# Patient Record
Sex: Female | Born: 1955 | Race: White | Hispanic: No | Marital: Married | Smoking: Former smoker
Health system: Southern US, Community
[De-identification: ages and names within clinical notes are randomized; demographics above are authoritative.]

## PROBLEM LIST (undated history)

## (undated) DIAGNOSIS — U071 COVID-19: Secondary | ICD-10-CM

## (undated) DIAGNOSIS — E785 Hyperlipidemia, unspecified: Secondary | ICD-10-CM

## (undated) DIAGNOSIS — E7800 Pure hypercholesterolemia, unspecified: Secondary | ICD-10-CM

## (undated) DIAGNOSIS — I10 Essential (primary) hypertension: Secondary | ICD-10-CM

## (undated) HISTORY — PX: CARDIAC SURGERY: SHX584

## (undated) HISTORY — DX: COVID-19: U07.1

---
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
## 1999-07-17 ENCOUNTER — Emergency Department (HOSPITAL_COMMUNITY): Admission: EM | Admit: 1999-07-17 | Discharge: 1999-07-17 | Payer: Self-pay

## 2003-05-16 ENCOUNTER — Ambulatory Visit (HOSPITAL_COMMUNITY): Admission: RE | Admit: 2003-05-16 | Discharge: 2003-05-17 | Payer: Self-pay | Admitting: Cardiology

## 2008-07-10 ENCOUNTER — Encounter: Admission: RE | Admit: 2008-07-10 | Discharge: 2008-07-10 | Payer: Self-pay | Admitting: Cardiology

## 2008-12-24 ENCOUNTER — Encounter: Admission: RE | Admit: 2008-12-24 | Discharge: 2008-12-24 | Payer: Self-pay | Admitting: Gynecology

## 2010-01-21 ENCOUNTER — Encounter
Admission: RE | Admit: 2010-01-21 | Discharge: 2010-01-21 | Payer: Self-pay | Source: Home / Self Care | Attending: Gynecology | Admitting: Gynecology

## 2010-02-15 ENCOUNTER — Encounter: Payer: Self-pay | Admitting: Cardiology

## 2010-06-11 NOTE — Cardiovascular Report (Signed)
Tiffany Ryan, Tiffany Ryan                              ACCOUNT NO.:  [id number]   MEDICAL RECORD NO.:  [id number]                   PATIENT TYPE:  OIB   LOCATION:  2899                                 FACILITY:  [facility]   PHYSICIAN:  Cristy Hilts. Jacinto Halim, M.D.                  DATE OF BIRTH:  1955/10/30   DATE OF PROCEDURE:  05/16/2003  DATE OF DISCHARGE:                              CARDIAC CATHETERIZATION   REFERRING PHYSICIAN:  Tracey Harries, M.D.   PROCEDURES PERFORMED:  1. Percutaneous transluminal coronary angioplasty and stenting of the right     coronary artery.  2. Left coronary arteriography.   CARDIOLOGIST:  Cristy Hilts. Jacinto Halim, M.D.   INDICATIONS:  Tiffany Ryan is a 55 year old Caucasian female with history  of hypertension, hyperlipidemia and strong family history of premature CAD  who has had a diagnostic cardiac catheterization at [facility]  and was found to have a 99% mid RCA stenosis and a 90% RCA stenosis.  Given  this she was transferred over [facility] for elective PCI of the  right coronary artery.  The left coronaries were visualized with the  possibility of reevaluating her mid to distal LAD lesion, which had about  70% stenosis by diagnostic cardiac catheterization, for possible  angioplasty.   IMPRESSION:  Successful percutaneous transluminal coronary angioplasty and  stenting of the distal and mid right coronary artery with a 3.0 x 18 and a  3.0 x 18 millimeter CYPHER stent deployed at 16 atmospheric pressure.  The  stenosis was reduced from 90% to 0% in the distal and 99% to 0% in the mid  segment.  Excellent results were noted with no evidence of dissection and no  evidence of thrombus at the end of the procedure.   RECOMMENDATIONS:  The patient will be followed all night with CPKs and BMP  will be monitored, and the CBC will also be monitored.  She will be  discharged home if she remains stable.  The patient is to continue  aggressive risk factor  modification with ____________  lipids, smoking  cessation and weight loss as indicated.  Unless the patient has recurrent  chest discomfort she will be managed medically for the left coronary  arterial disease in the LAD.   ANGIOGRAPHIC DATA:  Right Coronary Artery:  The right coronary artery is a  large caliber vessel and a dominant vessel giving a large PLV branch.  The  mid RCA had 99% stenosis and the distal RCA had 90% stenosis.  The entire  RCA showed mild-to-moderate diffuse luminal irregularity.   Left Main Coronary Artery:  The left main coronary artery had mild  calcification.   Left Anterior Descending:  The LAD is diffusely diseased and gives origin to  a large diagonal-1, again, which has diffuse, moderate disease.  The LAD  itself is diseased all the way to the apex;  and, the mid segment, again, has  70% stenosis.  The vessel appears to be a 2.0 mm vessel.   Circumflex Coronary Artery:  The circumflex coronary artery was visualized  and appeared to be normal.   Ramus Intermedius:  The ramus intermedius appeared to be normal.  Again,  mild disease is noted throughout the ramus constituting noncritical coronary  artery disease.   TECHNIQUE OF THE PROCEDURE:  Under the usual sterile precautions using a 7  French right femoral artery access a 7 [place] FL-4 guide with side holes was  placed to engage the right coronary artery.  A 190 cm x 0.014 inch ATW  guidewire was placed to cross into the RCA lesion.  Then a 2.5 x 12 mm  Voyager balloon was utilized and two inflations; one inflation in the mid  and one in the distal RCA were performed at eight and 10 atmospheric  pressure for 70 seconds.  The balloon was deflated and pulled back into the  guiding catheter.  Arteriography was performed.   After predilatation a 3.0 x 18 mm CYPHER stent was utilized to stent the  distal RCA and a similar size stent was utilize to stent the mid RCA.  The  inflations were performed at 16  atmospheric pressure for 66 seconds and 44  seconds respectively; and, after pulling the balloon into the guide catheter  200 mcg of intracoronary nitroglycerin was administered.  Angiography was  repeated.  Excellent results were noted.  After confirming the success the  wire was pulled out the RCA and angiography was repeated.  The guide  catheter was disengaged  and pulled out the body in the usual fashion.  A 6  [place] Judkins-4 diagnostic catheter was utilized to engage the left main  coronary artery and angiography was repeated of the left coronary arterial  tree for visualization of the LAD, for possible revascularization strategy.  Because of the diffuse disease and the diffuse nature of the disease in the  LAD medical therapy was opted for.   The guide catheter was disengaged and pulled out the body in the usual  fashion.   The patient was then transferred to the recovery area ____________ in stable  condition.   During the procedure Angiomax was utilized for anticoagulation and  therapeutic ACT was maintained.  The patient also received a total of 600 mg  of Plavix during and immediately after the PCI.                                               Cristy Hilts. Jacinto Halim, M.D.    Pilar Plate  D:  05/16/2003  T:  05/18/2003  Job:  [id number]   cc:   Tracey Harries, M.D.  [address]  [place]  [place] [address]  Fax: [phone]   [facility]

## 2014-01-07 ENCOUNTER — Other Ambulatory Visit (HOSPITAL_COMMUNITY)
Admission: RE | Admit: 2014-01-07 | Discharge: 2014-01-07 | Disposition: A | Discharge status: Home / Self Care | Payer: BC Managed Care – PPO | Source: Ambulatory Visit | Attending: Nurse Practitioner | Admitting: Nurse Practitioner

## 2014-01-07 ENCOUNTER — Other Ambulatory Visit: Payer: Self-pay | Admitting: Nurse Practitioner

## 2014-01-07 DIAGNOSIS — Z01419 Encounter for gynecological examination (general) (routine) without abnormal findings: Secondary | ICD-10-CM | POA: Diagnosis not present

## 2014-01-07 DIAGNOSIS — Z1151 Encounter for screening for human papillomavirus (HPV): Secondary | ICD-10-CM | POA: Diagnosis present

## 2014-01-09 LAB — CYTOLOGY - PAP

## 2014-03-22 ENCOUNTER — Encounter (HOSPITAL_COMMUNITY): Payer: Self-pay | Admitting: *Deleted

## 2014-03-22 ENCOUNTER — Emergency Department (HOSPITAL_COMMUNITY)
Admission: EM | Admit: 2014-03-22 | Discharge: 2014-03-22 | Disposition: A | Discharge status: Home / Self Care | Payer: No Typology Code available for payment source | Attending: Emergency Medicine | Admitting: Emergency Medicine

## 2014-03-22 ENCOUNTER — Emergency Department (HOSPITAL_COMMUNITY): Payer: No Typology Code available for payment source

## 2014-03-22 DIAGNOSIS — R630 Anorexia: Secondary | ICD-10-CM | POA: Insufficient documentation

## 2014-03-22 DIAGNOSIS — R1032 Left lower quadrant pain: Secondary | ICD-10-CM | POA: Diagnosis not present

## 2014-03-22 DIAGNOSIS — R1012 Left upper quadrant pain: Secondary | ICD-10-CM | POA: Diagnosis not present

## 2014-03-22 DIAGNOSIS — Z7982 Long term (current) use of aspirin: Secondary | ICD-10-CM | POA: Insufficient documentation

## 2014-03-22 DIAGNOSIS — Z79899 Other long term (current) drug therapy: Secondary | ICD-10-CM | POA: Insufficient documentation

## 2014-03-22 DIAGNOSIS — R739 Hyperglycemia, unspecified: Secondary | ICD-10-CM | POA: Diagnosis not present

## 2014-03-22 DIAGNOSIS — R109 Unspecified abdominal pain: Secondary | ICD-10-CM | POA: Diagnosis present

## 2014-03-22 DIAGNOSIS — Z87440 Personal history of urinary (tract) infections: Secondary | ICD-10-CM | POA: Insufficient documentation

## 2014-03-22 LAB — URINALYSIS, ROUTINE W REFLEX MICROSCOPIC
Bilirubin Urine: NEGATIVE
Glucose, UA: NEGATIVE mg/dL
HGB URINE DIPSTICK: NEGATIVE
KETONES UR: NEGATIVE mg/dL
LEUKOCYTES UA: NEGATIVE
NITRITE: NEGATIVE
PH: 7.5 (ref 5.0–8.0)
Protein, ur: NEGATIVE mg/dL
SPECIFIC GRAVITY, URINE: 1.018 (ref 1.005–1.030)
Urobilinogen, UA: 0.2 mg/dL (ref 0.0–1.0)

## 2014-03-22 LAB — CBC WITH DIFFERENTIAL/PLATELET
BASOS ABS: 0 10*3/uL (ref 0.0–0.1)
Basophils Relative: 0 % (ref 0–1)
EOS ABS: 0 10*3/uL (ref 0.0–0.7)
EOS PCT: 0 % (ref 0–5)
HEMATOCRIT: 40.7 % (ref 36.0–46.0)
Hemoglobin: 13.4 g/dL (ref 12.0–15.0)
LYMPHS ABS: 2 10*3/uL (ref 0.7–4.0)
LYMPHS PCT: 32 % (ref 12–46)
MCH: 30.2 pg (ref 26.0–34.0)
MCHC: 32.9 g/dL (ref 30.0–36.0)
MCV: 91.7 fL (ref 78.0–100.0)
MONOS PCT: 5 % (ref 3–12)
Monocytes Absolute: 0.3 10*3/uL (ref 0.1–1.0)
NEUTROS ABS: 4 10*3/uL (ref 1.7–7.7)
Neutrophils Relative %: 63 % (ref 43–77)
Platelets: 327 10*3/uL (ref 150–400)
RBC: 4.44 MIL/uL (ref 3.87–5.11)
RDW: 12.9 % (ref 11.5–15.5)
WBC: 6.2 10*3/uL (ref 4.0–10.5)

## 2014-03-22 LAB — COMPREHENSIVE METABOLIC PANEL
ALBUMIN: 4.3 g/dL (ref 3.5–5.2)
ALT: 20 U/L (ref 0–35)
ANION GAP: 5 (ref 5–15)
AST: 18 U/L (ref 0–37)
Alkaline Phosphatase: 52 U/L (ref 39–117)
BILIRUBIN TOTAL: 0.5 mg/dL (ref 0.3–1.2)
BUN: 9 mg/dL (ref 6–23)
CHLORIDE: 107 mmol/L (ref 96–112)
CO2: 27 mmol/L (ref 19–32)
Calcium: 9.5 mg/dL (ref 8.4–10.5)
Creatinine, Ser: 0.69 mg/dL (ref 0.50–1.10)
GFR calc Af Amer: >90 mL/min (ref >90)
GFR calc non Af Amer: >90 mL/min (ref >90)
Glucose, Bld: 146 mg/dL — ABNORMAL HIGH (ref 70–99)
POTASSIUM: 3.6 mmol/L (ref 3.5–5.1)
Sodium: 139 mmol/L (ref 135–145)
TOTAL PROTEIN: 7.3 g/dL (ref 6.0–8.3)

## 2014-03-22 LAB — LIPASE, BLOOD: LIPASE: 24 U/L (ref 11–59)

## 2014-03-22 MED ORDER — ONDANSETRON HCL 4 MG PO TABS: 4.0000 mg | ORAL_TABLET | Freq: Three times a day (TID) | ORAL | Status: DC | PRN

## 2014-03-22 MED ORDER — PHENAZOPYRIDINE HCL 200 MG PO TABS: 200.0000 mg | ORAL_TABLET | Freq: Three times a day (TID) | ORAL | Status: DC

## 2014-03-22 MED ORDER — SODIUM CHLORIDE 0.9 % IV BOLUS (SEPSIS)
1000.0000 mL | Freq: Once | INTRAVENOUS | Status: AC
  Administered 2014-03-22: 1000 mL via INTRAVENOUS

## 2014-03-22 MED ORDER — ONDANSETRON HCL 4 MG/2ML IJ SOLN
4.0000 mg | Freq: Once | INTRAMUSCULAR | Status: AC
  Administered 2014-03-22: 4 mg via INTRAVENOUS
  Filled 2014-03-22: qty 2

## 2014-03-22 NOTE — ED Notes (Signed)
Pt reports having lower abd pain for months, is more left side and radiates around to her side and back. Reports being started on antibiotics yesterday for UTI but no relief yet and had n/v this am. Denies difficulty urinating, only burning pain.

## 2014-03-22 NOTE — Discharge Instructions (Signed)
Read the information below.  Use the prescribed medication as directed.  Please discuss all new medications with your pharmacist.  You may return to the Emergency Department at any time for worsening condition or any new symptoms that concern you.  If you develop high fevers, worsening abdominal pain, uncontrolled vomiting, or are unable to tolerate fluids by mouth, return to the ER for a recheck.     Flank Pain Flank pain refers to pain that is located on the side of the body between the upper abdomen and the back. The pain may occur over a short period of time (acute) or may be long-term or reoccurring (chronic). It may be mild or severe. Flank pain can be caused by many things. CAUSES  Some of the more common causes of flank pain include:  Muscle strains.   Muscle spasms.   A disease of your spine (vertebral disk disease).   A lung infection (pneumonia).   Fluid around your lungs (pulmonary edema).   A kidney infection.   Kidney stones.   A very painful skin rash caused by the chickenpox virus (shingles).   Gallbladder disease.  HOME CARE INSTRUCTIONS  Home care will depend on the cause of your pain. In general,  Rest as directed by your caregiver.  Drink enough fluids to keep your urine clear or pale yellow.  Only take over-the-counter or prescription medicines as directed by your caregiver. Some medicines may help relieve the pain.  Tell your caregiver about any changes in your pain.  Follow up with your caregiver as directed. SEEK IMMEDIATE MEDICAL CARE IF:   Your pain is not controlled with medicine.   You have new or worsening symptoms.  Your pain increases.   You have abdominal pain.   You have shortness of breath.   You have persistent nausea or vomiting.   You have swelling in your abdomen.   You feel faint or pass out.   You have blood in your urine.  You have a fever or persistent symptoms for more than 2-3 days.  You have a  fever and your symptoms suddenly get worse. MAKE SURE YOU:   Understand these instructions.  Will watch your condition.  Will get help right away if you are not doing well or get worse. Document Released: 03/03/2005 Document Revised: 10/05/2011 Document Reviewed: 08/25/2011 [facility] Patient Information 2015 [place], [place]. This information is not intended to replace advice given to you by your health care provider. Make sure you discuss any questions you have with your health care provider.   Abdominal Pain Many things can cause abdominal pain. Usually, abdominal pain is not caused by a disease and will improve without treatment. It can often be observed and treated at home. Your health care provider will do a physical exam and possibly order blood tests and X-rays to help determine the seriousness of your pain. However, in many cases, more time must pass before a clear cause of the pain can be found. Before that point, your health care provider may not know if you need more testing or further treatment. HOME CARE INSTRUCTIONS  Monitor your abdominal pain for any changes. The following actions may help to alleviate any discomfort you are experiencing:  Only take over-the-counter or prescription medicines as directed by your health care provider.  Do not take laxatives unless directed to do so by your health care provider.  Try a clear liquid diet (broth, tea, or water) as directed by your health care provider. Slowly move  to a bland diet as tolerated. SEEK MEDICAL CARE IF:  You have unexplained abdominal pain.  You have abdominal pain associated with nausea or diarrhea.  You have pain when you urinate or have a bowel movement.  You experience abdominal pain that wakes you in the night.  You have abdominal pain that is worsened or improved by eating food.  You have abdominal pain that is worsened with eating fatty foods.  You have a fever. SEEK IMMEDIATE MEDICAL CARE IF:   Your  pain does not go away within 2 hours.  You keep throwing up (vomiting).  Your pain is felt only in portions of the abdomen, such as the right side or the left lower portion of the abdomen.  You pass bloody or black tarry stools. MAKE SURE YOU:  Understand these instructions.   Will watch your condition.   Will get help right away if you are not doing well or get worse.  Document Released: 10/20/2004 Document Revised: 01/15/2013 Document Reviewed: 09/19/2012 [facility] Patient Information 2015 [place]. This information is not intended to replace advice given to you by your health care provider. Make sure you discuss any questions you have with your health care provider.

## 2014-03-22 NOTE — ED Provider Notes (Signed)
CSN: [id number]     Arrival date & time 03/22/14  1217 History   First MD Initiated Contact with Patient 03/22/14 [phone]     Chief Complaint  Patient presents with  . Abdominal Pain     (Consider location/radiation/quality/duration/timing/severity/associated sxs/prior Treatment) The history is provided by the patient and a relative.     Patient presents with left sided abdominal pain, dysuria, N/V.  She has had intermittent lower abdominal burning and pressure for the past several months, with intermittent urinary symptoms and treatment for UTIs, has had one month of more persistent intermittent LLQ pain.  Yesterday she was seen at her PCP [facility] and was started on Ciprofloxacin for UTI - told she had hematuria but few bacteria.  She has taken 3 doses.  Today she developed more diffuse pain, N/V, anorexia.   No hx abdominal surgeries.  She believes her mother had a hx kidney stones, she also had a kidney removed but pt does not know why.  Pt does not have hx kidney stones.    History reviewed. No pertinent past medical history. History reviewed. No pertinent past surgical history. History reviewed. No pertinent family history. History  Substance Use Topics  . Smoking status: Never Smoker   . Smokeless tobacco: Not on file  . Alcohol Use: No   OB History    No data available     Review of Systems  All other systems reviewed and are negative.     Allergies  Review of patient's allergies indicates no known allergies.  Home Medications   Prior to Admission medications   Medication Sig Start Date End Date Taking? Authorizing Provider  aspirin 81 MG tablet Take 81 mg by mouth daily.   Yes Historical Provider, MD  ciprofloxacin (CIPRO) 500 MG tablet Take 500 mg by mouth 2 (two) times daily. Take for 5 days. First dose on 2.26.16   Yes Historical Provider, MD  ezetimibe (ZETIA) 10 MG tablet Take 10 mg by mouth daily.   Yes Historical Provider, MD  metoprolol tartrate (LOPRESSOR) 25  MG tablet Take 25 mg by mouth 2 (two) times daily.   Yes Historical Provider, MD  ramipril (ALTACE) 10 MG capsule Take 10 mg by mouth daily.   Yes Historical Provider, MD  rosuvastatin (CRESTOR) 40 MG tablet Take 40 mg by mouth daily.   Yes Historical Provider, MD   BP 155/70 mmHg  Pulse 80  Temp(Src) 97.9 F (36.6 C) (Oral)  Resp 18  Ht 5\' 2"  (1.575 m)  Wt 160 lb (72.576 kg)  BMI 29.26 kg/m2  SpO2 99% Physical Exam  Constitutional: She appears well-developed and well-nourished. No distress.  HENT:  Head: Normocephalic and atraumatic.  Neck: Neck supple.  Cardiovascular: Normal rate and regular rhythm.   Pulmonary/Chest: Effort normal and breath sounds normal. No respiratory distress. She has no wheezes. She has no rales.  Abdominal: Soft. She exhibits no distension and no mass. There is tenderness in the left upper quadrant and left lower quadrant. There is no rebound, no guarding and no CVA tenderness.  Neurological: She is alert.  Skin: She is not diaphoretic.  Nursing note and vitals reviewed.   ED Course  Procedures (including critical care time) Labs Review Labs Reviewed  COMPREHENSIVE METABOLIC PANEL - Abnormal; Notable for the following:    Glucose, Bld 146 (*)    All other components within normal limits  URINALYSIS, ROUTINE W REFLEX MICROSCOPIC - Abnormal; Notable for the following:    APPearance CLOUDY (*)  All other components within normal limits  CBC WITH DIFFERENTIAL/PLATELET  LIPASE, BLOOD    Imaging Review Ct Abdomen Pelvis Wo Contrast  03/22/2014   CLINICAL DATA:  Lower abdominal pain for months, left-sided, radiating to back.  EXAM: CT ABDOMEN AND PELVIS WITHOUT CONTRAST  TECHNIQUE: Multidetector CT imaging of the abdomen and pelvis was performed following the standard protocol without IV contrast.  COMPARISON:  None.  FINDINGS: Lung bases are clear. No effusions. Heart is normal size. Right coronary artery calcifications present.  Liver, gallbladder,  spleen, pancreas, adrenals and kidneys have an unremarkable unenhanced appearance. No renal or ureteral stones. No hydronephrosis. Urinary bladder is unremarkable.  Uterus, adnexa, urinary bladder unremarkable. No free fluid, free air or adenopathy. Appendix is visualized and is normal. Stomach, large and small bowel are unremarkable. Aortic calcifications diffusely. No aneurysm.  No acute bony abnormality or focal bone lesion. Degenerative disc disease at L5-S1 with disc space narrowing, vacuum disc and spurring.  IMPRESSION: No renal or ureteral stones.  No hydronephrosis.  Coronary artery disease.  No acute findings in the abdomen or pelvis.   Electronically Signed   By: Charlett Nose M.D.   On: 03/22/2014 17:33     EKG Interpretation None      MDM   Final diagnoses:  Left flank pain  Hyperglycemia    Afebrile nontoxic patient with left flank pain and dysuria, also with hx chronic intermittent burning lower abdominal pain.  Started on Cipro yesterday by PCP. Labs remarkable for hyperglycemia (146).  CT abd/pelvis negative.  UA cloudy but otherwise unremarkable.  I suspect pt has partially treated UTI.  Original UA performed by [facility] PCP.  Pt also has some chronic lower abdominal pain but notes she has had multiple workups and has recently had normal pap and gynecologic exam (December 2015) and normal colonoscopy (December 2015).  D/C home with pyridium and zofran, instructed to continue cipro and to follow up with PCP.  Discussed result, findings, treatment, and follow up  with patient.  Pt given return precautions.  Pt verbalizes understanding and agrees with plan.        Trixie Dredge, PA-C 03/22/14 7425  Vanetta Mulders, MD 03/23/14 2255

## 2014-03-24 LAB — URINE CULTURE
COLONY COUNT: NO GROWTH
CULTURE: NO GROWTH

## 2015-01-13 ENCOUNTER — Other Ambulatory Visit (HOSPITAL_COMMUNITY)
Admission: RE | Admit: 2015-01-13 | Discharge: 2015-01-13 | Disposition: A | Discharge status: Home / Self Care | Payer: BLUE CROSS/BLUE SHIELD | Source: Ambulatory Visit | Attending: Nurse Practitioner | Admitting: Nurse Practitioner

## 2015-01-13 ENCOUNTER — Other Ambulatory Visit: Payer: Self-pay | Admitting: Nurse Practitioner

## 2015-01-13 DIAGNOSIS — Z1151 Encounter for screening for human papillomavirus (HPV): Secondary | ICD-10-CM | POA: Diagnosis present

## 2015-01-13 DIAGNOSIS — Z01419 Encounter for gynecological examination (general) (routine) without abnormal findings: Secondary | ICD-10-CM | POA: Insufficient documentation

## 2015-01-15 LAB — CYTOLOGY - PAP

## 2015-11-18 ENCOUNTER — Other Ambulatory Visit (HOSPITAL_COMMUNITY): Payer: Self-pay | Admitting: Internal Medicine

## 2015-11-18 DIAGNOSIS — R1033 Periumbilical pain: Secondary | ICD-10-CM

## 2015-11-24 ENCOUNTER — Ambulatory Visit (HOSPITAL_COMMUNITY): Payer: No Typology Code available for payment source

## 2015-11-27 ENCOUNTER — Ambulatory Visit (HOSPITAL_COMMUNITY)
Admission: RE | Admit: 2015-11-27 | Discharge: 2015-11-27 | Disposition: A | Discharge status: Home / Self Care | Payer: BLUE CROSS/BLUE SHIELD | Source: Ambulatory Visit | Attending: Internal Medicine | Admitting: Internal Medicine

## 2015-11-27 DIAGNOSIS — R1033 Periumbilical pain: Secondary | ICD-10-CM | POA: Diagnosis present

## 2016-02-05 DIAGNOSIS — I447 Left bundle-branch block, unspecified: Secondary | ICD-10-CM | POA: Diagnosis not present

## 2016-02-05 DIAGNOSIS — Z9861 Coronary angioplasty status: Secondary | ICD-10-CM | POA: Diagnosis not present

## 2016-02-05 DIAGNOSIS — E7800 Pure hypercholesterolemia, unspecified: Secondary | ICD-10-CM | POA: Diagnosis not present

## 2016-02-05 DIAGNOSIS — I2510 Atherosclerotic heart disease of native coronary artery without angina pectoris: Secondary | ICD-10-CM | POA: Diagnosis not present

## 2016-03-01 DIAGNOSIS — R0989 Other specified symptoms and signs involving the circulatory and respiratory systems: Secondary | ICD-10-CM | POA: Diagnosis not present

## 2016-03-06 ENCOUNTER — Emergency Department (HOSPITAL_COMMUNITY): Payer: BLUE CROSS/BLUE SHIELD

## 2016-03-06 ENCOUNTER — Encounter (HOSPITAL_COMMUNITY): Payer: Self-pay | Admitting: Emergency Medicine

## 2016-03-06 ENCOUNTER — Observation Stay (HOSPITAL_COMMUNITY)
Admission: EM | Admit: 2016-03-06 | Discharge: 2016-03-08 | Disposition: A | Discharge status: Home / Self Care | Payer: BLUE CROSS/BLUE SHIELD | Attending: Cardiology | Admitting: Cardiology

## 2016-03-06 DIAGNOSIS — Z7982 Long term (current) use of aspirin: Secondary | ICD-10-CM | POA: Insufficient documentation

## 2016-03-06 DIAGNOSIS — I2584 Coronary atherosclerosis due to calcified coronary lesion: Secondary | ICD-10-CM | POA: Diagnosis not present

## 2016-03-06 DIAGNOSIS — Z87891 Personal history of nicotine dependence: Secondary | ICD-10-CM | POA: Insufficient documentation

## 2016-03-06 DIAGNOSIS — I200 Unstable angina: Secondary | ICD-10-CM | POA: Diagnosis not present

## 2016-03-06 DIAGNOSIS — E785 Hyperlipidemia, unspecified: Secondary | ICD-10-CM | POA: Insufficient documentation

## 2016-03-06 DIAGNOSIS — I119 Hypertensive heart disease without heart failure: Secondary | ICD-10-CM | POA: Insufficient documentation

## 2016-03-06 DIAGNOSIS — Z955 Presence of coronary angioplasty implant and graft: Secondary | ICD-10-CM | POA: Insufficient documentation

## 2016-03-06 DIAGNOSIS — I700 Atherosclerosis of aorta: Secondary | ICD-10-CM | POA: Insufficient documentation

## 2016-03-06 DIAGNOSIS — I447 Left bundle-branch block, unspecified: Secondary | ICD-10-CM | POA: Insufficient documentation

## 2016-03-06 DIAGNOSIS — Z8249 Family history of ischemic heart disease and other diseases of the circulatory system: Secondary | ICD-10-CM | POA: Diagnosis not present

## 2016-03-06 DIAGNOSIS — I25110 Atherosclerotic heart disease of native coronary artery with unstable angina pectoris: Principal | ICD-10-CM | POA: Insufficient documentation

## 2016-03-06 DIAGNOSIS — E7800 Pure hypercholesterolemia, unspecified: Secondary | ICD-10-CM | POA: Diagnosis not present

## 2016-03-06 HISTORY — DX: Unstable angina: I20.0

## 2016-03-06 HISTORY — DX: Pure hypercholesterolemia, unspecified: E78.00

## 2016-03-06 HISTORY — DX: Essential (primary) hypertension: I10

## 2016-03-06 LAB — BASIC METABOLIC PANEL
Anion gap: 9 (ref 5–15)
BUN: 9 mg/dL (ref 6–20)
CALCIUM: 9.9 mg/dL (ref 8.9–10.3)
CO2: 25 mmol/L (ref 22–32)
Chloride: 106 mmol/L (ref 101–111)
Creatinine, Ser: 0.74 mg/dL (ref 0.44–1.00)
GFR calc Af Amer: >60 mL/min (ref >60)
GLUCOSE: 129 mg/dL — AB (ref 65–99)
Potassium: 4.4 mmol/L (ref 3.5–5.1)
SODIUM: 140 mmol/L (ref 135–145)

## 2016-03-06 LAB — CBC
HCT: 41.1 % (ref 36.0–46.0)
Hemoglobin: 13.5 g/dL (ref 12.0–15.0)
MCH: 30.3 pg (ref 26.0–34.0)
MCHC: 32.8 g/dL (ref 30.0–36.0)
MCV: 92.2 fL (ref 78.0–100.0)
Platelets: 318 10*3/uL (ref 150–400)
RBC: 4.46 MIL/uL (ref 3.87–5.11)
RDW: 13.1 % (ref 11.5–15.5)
WBC: 5.3 10*3/uL (ref 4.0–10.5)

## 2016-03-06 LAB — PROTIME-INR
INR: 1.07
PROTHROMBIN TIME: 14 s (ref 11.4–15.2)

## 2016-03-06 LAB — HEPARIN LEVEL (UNFRACTIONATED): Heparin Unfractionated: 0.42 IU/mL (ref 0.30–0.70)

## 2016-03-06 LAB — I-STAT TROPONIN, ED: TROPONIN I, POC: 0 ng/mL (ref 0.00–0.08)

## 2016-03-06 LAB — TROPONIN I
Troponin I: <0.03 ng/mL (ref <0.03)
Troponin I: <0.03 ng/mL (ref <0.03)

## 2016-03-06 MED ORDER — ASPIRIN EC 81 MG PO TBEC
81.0000 mg | DELAYED_RELEASE_TABLET | Freq: Every day | ORAL | Status: DC
  Administered 2016-03-07: 81 mg via ORAL
  Filled 2016-03-06 (×2): qty 1

## 2016-03-06 MED ORDER — MAGNESIUM 200 MG PO TABS
30.0000 mg | ORAL_TABLET | ORAL | Status: DC
  Filled 2016-03-06: qty 1

## 2016-03-06 MED ORDER — MAGNESIUM OXIDE 400 (241.3 MG) MG PO TABS
200.0000 mg | ORAL_TABLET | ORAL | Status: DC
  Administered 2016-03-07: 200 mg via ORAL
  Filled 2016-03-06: qty 1

## 2016-03-06 MED ORDER — ASPIRIN 81 MG PO CHEW
324.0000 mg | CHEWABLE_TABLET | Freq: Once | ORAL | Status: AC
  Administered 2016-03-06: 324 mg via ORAL
  Filled 2016-03-06: qty 4

## 2016-03-06 MED ORDER — ASPIRIN EC 81 MG PO TBEC: 81.0000 mg | DELAYED_RELEASE_TABLET | Freq: Every day | ORAL | Status: DC

## 2016-03-06 MED ORDER — VITAMIN D 1000 UNITS PO TABS
2000.0000 [IU] | ORAL_TABLET | Freq: Every day | ORAL | Status: DC
  Administered 2016-03-07: 2000 [IU] via ORAL
  Filled 2016-03-06 (×2): qty 2

## 2016-03-06 MED ORDER — ACETAMINOPHEN 325 MG PO TABS: 650.0000 mg | ORAL_TABLET | ORAL | Status: DC | PRN

## 2016-03-06 MED ORDER — ROSUVASTATIN CALCIUM 10 MG PO TABS
20.0000 mg | ORAL_TABLET | Freq: Every day | ORAL | Status: DC
  Administered 2016-03-06 – 2016-03-07 (×2): 20 mg via ORAL
  Filled 2016-03-06 (×3): qty 2

## 2016-03-06 MED ORDER — HEPARIN BOLUS VIA INFUSION
4000.0000 [IU] | Freq: Once | INTRAVENOUS | Status: AC
  Administered 2016-03-06: 4000 [IU] via INTRAVENOUS
  Filled 2016-03-06: qty 4000

## 2016-03-06 MED ORDER — ONDANSETRON HCL 4 MG/2ML IJ SOLN: 4.0000 mg | Freq: Four times a day (QID) | INTRAMUSCULAR | Status: DC | PRN

## 2016-03-06 MED ORDER — VITAMIN D 1000 UNITS PO TABS: 2000.0000 [IU] | ORAL_TABLET | Freq: Every day | ORAL | Status: DC

## 2016-03-06 MED ORDER — NITROGLYCERIN 0.4 MG SL SUBL
0.4000 mg | SUBLINGUAL_TABLET | SUBLINGUAL | Status: DC | PRN
Start: 2016-03-06 — End: 2016-03-08

## 2016-03-06 MED ORDER — EZETIMIBE 10 MG PO TABS
10.0000 mg | ORAL_TABLET | Freq: Every day | ORAL | Status: DC
  Administered 2016-03-06 – 2016-03-07 (×2): 10 mg via ORAL
  Filled 2016-03-06 (×3): qty 1

## 2016-03-06 MED ORDER — RAMIPRIL 5 MG PO CAPS
10.0000 mg | ORAL_CAPSULE | Freq: Every day | ORAL | Status: DC
  Administered 2016-03-07: 10 mg via ORAL
  Filled 2016-03-06 (×2): qty 2

## 2016-03-06 MED ORDER — CARVEDILOL 6.25 MG PO TABS
6.2500 mg | ORAL_TABLET | Freq: Two times a day (BID) | ORAL | Status: DC
  Administered 2016-03-06 – 2016-03-07 (×3): 6.25 mg via ORAL
  Filled 2016-03-06 (×4): qty 1

## 2016-03-06 MED ORDER — ZOLPIDEM TARTRATE 5 MG PO TABS
5.0000 mg | ORAL_TABLET | Freq: Every evening | ORAL | Status: DC | PRN
  Administered 2016-03-06: 5 mg via ORAL
  Filled 2016-03-06: qty 1

## 2016-03-06 MED ORDER — ROSUVASTATIN CALCIUM 10 MG PO TABS: 20.0000 mg | ORAL_TABLET | Freq: Every day | ORAL | Status: DC

## 2016-03-06 MED ORDER — HEPARIN (PORCINE) IN NACL 100-0.45 UNIT/ML-% IJ SOLN
800.0000 [IU]/h | INTRAMUSCULAR | Status: DC
  Administered 2016-03-06: 800 [IU]/h via INTRAVENOUS
  Filled 2016-03-06: qty 250

## 2016-03-06 NOTE — Progress Notes (Signed)
ANTICOAGULATION CONSULT NOTE - Follow-up Consult  Pharmacy Consult for heparin Indication: chest pain/ACS  No Known Allergies  Patient Measurements: Height: 5\' 2"  (157.5 cm) Weight: 158 lb (71.7 kg) IBW/kg (Calculated) : 50.1 Heparin Dosing Weight: 65kg  Vital Signs: Temp: 98.3 F (36.8 C) (02/11 2145) Temp Source: Oral (02/11 2145) BP: 128/74 (02/11 2145) Pulse Rate: 71 (02/11 2145)  Labs:  Recent Labs  03/06/16 1134 03/06/16 1815 03/06/16 2303  HGB 13.5  --   --   HCT 41.1  --   --   PLT 318  --   --   LABPROT  --  14.0  --   INR  --  1.07  --   HEPARINUNFRC  --   --  0.42  CREATININE 0.74  --   --   TROPONINI  --  <0.03  --     Estimated Creatinine Clearance: 69.3 mL/min (by C-G formula based on SCr of 0.74 mg/dL).  Assessment: Tiffany Ryan on heparin for r/o ACS. Heparin level therapeutic (0.42) on gtt at 800 units/hr. No bleeding noted.   Goal of Therapy:  Heparin level 0.3-0.7 units/ml Monitor platelets by anticoagulation protocol: Yes   Plan:  Continue heparin gtt 800 units/hr Daily heparin level and CBC  Christoper Fabianaron Adedamola Seto, PharmD, BCPS Clinical pharmacist, pager [phone] 03/06/2016,11:39 PM

## 2016-03-06 NOTE — ED Provider Notes (Signed)
[facility]-EMERGENCY DEPT Provider Note   CSN: [id number] Arrival date & time: 03/06/16  1119     History   Chief Complaint Chief Complaint  Patient presents with  . Chest Pain    HPI Tiffany Ryan is a 61 y.o. female.  HPI  61 y.o. Female with h.o. Cad presents today with worsening chest pain.  She has associated hypertension.  Chest pain began 7 days ago with minimal exertion sscp radiates to left, increases with exertion and is relieved by rest or nitro.  Pain occurs with any exertion, associated dyspnea. No diaphoresis.  No fever, chills, cough or uri symptoms.Patient with cath 2005-  INDICATIONS:  Mrs. Tiffany Ryan is a 61 year old Caucasian female with history  of hypertension, hyperlipidemia and strong family history of premature CAD  who has had a diagnostic cardiac catheterization at [facility]  and was found to have a 99% mid RCA stenosis and a 90% RCA stenosis.  Given  this she was transferred over [facility] for elective PCI of the  right coronary artery.  The left coronaries were visualized with the  possibility of reevaluating her mid to distal LAD lesion, which had about  70% stenosis by diagnostic cardiac catheterization, for possible  angioplasty.    IMPRESSION:  Successful percutaneous transluminal coronary angioplasty and  stenting of the distal and mid right coronary artery with a 3.0 x 18 and a  3.0 x 18 millimeter CYPHER stent deployed at 16 atmospheric pressure.  The  stenosis was reduced from 90% to 0% in the distal and 99% to 0% in the mid  segment.  Excellent results were noted with no evidence of dissection and no  evidence of thrombus at the end of the procedure.    RECOMMENDATIONS:  The patient will be followed all night with CPKs and BMP  will be monitored, and the CBC will also be monitored.  She will be  discharged home if she remains stable.  The patient is to continue  aggressive risk factor modification with ____________  lipids,  smoking  cessation and weight loss as indicated.  Unless the patient has recurrent  chest discomfort she will be managed medically for the left coronary  arterial disease in the LAD.   Past Medical History:  Diagnosis Date  . High cholesterol   . Hypertension     There are no active problems to display for this patient.   Past Surgical History:  Procedure Laterality Date  . CARDIAC SURGERY     stent x 2    OB History    No data available       Home Medications    Prior to Admission medications   Medication Sig Start Date End Date Taking? Authorizing Provider  aspirin 81 MG tablet Take 81 mg by mouth daily.    Historical Provider, MD  ciprofloxacin (CIPRO) 500 MG tablet Take 500 mg by mouth 2 (two) times daily. Take for 5 days. First dose on 2.26.16    Historical Provider, MD  ezetimibe (ZETIA) 10 MG tablet Take 10 mg by mouth daily.    Historical Provider, MD  metoprolol tartrate (LOPRESSOR) 25 MG tablet Take 25 mg by mouth 2 (two) times daily.    Historical Provider, MD  ondansetron (ZOFRAN) 4 MG tablet Take 1 tablet (4 mg total) by mouth every 8 (eight) hours as needed for nausea or vomiting. 03/22/14   Tiffany Dredge, PA-C  phenazopyridine (PYRIDIUM) 200 MG tablet Take 1 tablet (200 mg total) by  mouth 3 (three) times daily. 03/22/14   Tiffany Dredge, PA-C  ramipril (ALTACE) 10 MG capsule Take 10 mg by mouth daily.    Historical Provider, MD  rosuvastatin (CRESTOR) 40 MG tablet Take 40 mg by mouth daily.    Historical Provider, MD    Family History No family history on file.  Social History Social History  Substance Use Topics  . Smoking status: Never Smoker  . Smokeless tobacco: Never Used  . Alcohol use No     Allergies   Patient has no known allergies.   Review of Systems Review of Systems  All other systems reviewed and are negative.    Physical Exam Updated Vital Signs BP 135/97   Pulse 65   Temp 97.9 F (36.6 C) (Oral)   Resp 18   Ht 5\' 2"  (1.575 m)    Wt 71.7 kg   SpO2 95%   BMI 28.90 kg/m   Physical Exam  Constitutional: She is oriented to person, place, and time. She appears well-developed and well-nourished. No distress.  HENT:  Head: Normocephalic and atraumatic.  Right Ear: External ear normal.  Left Ear: External ear normal.  Nose: Nose normal.  Eyes: Conjunctivae and EOM are normal. Pupils are equal, round, and reactive to light.  Neck: Normal range of motion. Neck supple.  Cardiovascular: Normal rate, regular rhythm and normal heart sounds.   Pulmonary/Chest: Effort normal and breath sounds normal.  Abdominal: Soft. Bowel sounds are normal.  Musculoskeletal: Normal range of motion.  Neurological: She is alert and oriented to person, place, and time. She exhibits normal muscle tone. Coordination normal.  Skin: Skin is warm and dry.  Psychiatric: She has a normal mood and affect. Her behavior is normal. Thought content normal.  Nursing note and vitals reviewed.    ED Treatments / Results  Labs (all labs ordered are listed, but only abnormal results are displayed) Labs Reviewed  BASIC METABOLIC PANEL - Abnormal; Notable for the following:       Result Value   Glucose, Bld 129 (*)    All other components within normal limits  CBC  I-STAT TROPOININ, ED    EKG  EKG Interpretation  Date/Time:  Sunday March 06 2016 11:23:45 EST Ventricular Rate:  76 PR Interval:  170 QRS Duration: 144 QT Interval:  396 QTC Calculation: 445 R Axis:   17 Text Interpretation:  Normal sinus rhythm Left bundle branch block Abnormal ECG Confirmed by Md Smola MD, Duwayne Heck [phone]) on 03/06/2016 2:24:12 PM       Radiology Dg Chest 2 View  Result Date: 03/06/2016 CLINICAL DATA:  Chest pain EXAM: CHEST  2 VIEW COMPARISON:  Chest CT 07/10/2008 FINDINGS: Heart and mediastinal contours are within normal limits. No focal opacities or effusions. No acute bony abnormality. IMPRESSION: No active cardiopulmonary disease. Electronically Signed    By: Charlett Nose M.D.   On: 03/06/2016 12:01    Procedures Procedures (including critical care time)  Medications Ordered in ED Medications - No data to display   Initial Impression / Assessment and Plan / ED Course  I have reviewed the triage vital signs and the nursing notes.  Pertinent labs & imaging results that were available during my care of the patient were reviewed by me and considered in my medical decision making (see chart for details).    Plan admission to cardiology for further evaluation and treatment.   Discussed with Dr. Jacinto Halim and he will admit for probable cath.  Final Clinical Impressions(s) / ED  Diagnoses   Final diagnoses:  Unstable angina Tiffany Ryan(HCC)    New Prescriptions New Prescriptions   No medications on file     Margarita Grizzleanielle Krystle Oberman, MD 03/08/16 1112

## 2016-03-06 NOTE — H&P (Signed)
Tiffany Ryan is an 61 y.o. female.   Chief Complaint: Chest pain HPI: Tiffany Ryan  is a 61 y.o. female  from [place] who has history of known coronary artery disease S/P 3.0 x 18 mm Cypher drug-eluting stent placed on 05/16/2003. She has a residual 70% mid to distal LAD stenosis. She also has history of hypertension, hyperlipidemia and a very strong family history of premature coronary artery disease.  Past Medical History:  Diagnosis Date  . High cholesterol   . Hypertension     Past Surgical History:  Procedure Laterality Date  . CARDIAC SURGERY     stent x 2    F amily history: Father  Deceased. at age 65 from TIA ( Had an MI at age 20 ) . Social History: Quit smoking about 10-12 years ago. Drinks alcohol rarely. No illicit drug use.  Allergies: No Known Allergies  Review of Systems - Negative except chest pain, mild associated dyspnea on exertion. Has headache since receiving NTG, other systems negative.   Blood pressure 144/77, pulse 64, temperature 97.9 F (36.6 C), temperature source Oral, resp. rate 16, height '5\' 2"'$  (1.575 m), weight 158 lb (71.7 kg), SpO2 96 %. General appearance: alert, cooperative, appears stated age and no distress Eyes: negative findings: lids and lashes normal Neck: no adenopathy, no JVD, supple, symmetrical, trachea midline, thyroid not enlarged, symmetric, no tenderness/mass/nodules and bilateral carotid bruit soft present Neck: JVP - normal, carotids 2+= without bruits Resp: clear to auscultation bilaterally Chest wall: no tenderness Cardio: regular rate and rhythm, S1, S2 normal, no murmur, click, rub or gallop GI: soft, non-tender; bowel sounds normal; no masses,  no organomegaly Extremities: extremities normal, atraumatic, no cyanosis or edema Pulses: 2+ and symmetric Skin: Skin color, texture, turgor normal. No rashes or lesions Neurologic: Grossly normal  Results for orders placed or performed during the hospital encounter of 03/06/16 (from  the past 48 hour(s))  Basic metabolic panel     Status: Abnormal   Collection Time: 03/06/16 11:34 AM  Result Value Ref Range   Sodium 140 135 - 145 mmol/L   Potassium 4.4 3.5 - 5.1 mmol/L   Chloride 106 101 - 111 mmol/L   CO2 25 22 - 32 mmol/L   Glucose, Bld 129 (H) 65 - 99 mg/dL   BUN 9 6 - 20 mg/dL   Creatinine, Ser 0.74 0.44 - 1.00 mg/dL   Calcium 9.9 8.9 - 10.3 mg/dL   GFR calc non Af Amer >60 >60 mL/min   GFR calc Af Amer >60 >60 mL/min    Comment: (NOTE) The eGFR has been calculated using the CKD EPI equation. This calculation has not been validated in all clinical situations. eGFR's persistently <60 mL/min signify possible Chronic Kidney Disease.    Anion gap 9 5 - 15  CBC     Status: None   Collection Time: 03/06/16 11:34 AM  Result Value Ref Range   WBC 5.3 4.0 - 10.5 K/uL   RBC 4.46 3.87 - 5.11 MIL/uL   Hemoglobin 13.5 12.0 - 15.0 g/dL   HCT 41.1 36.0 - 46.0 %   MCV 92.2 78.0 - 100.0 fL   MCH 30.3 26.0 - 34.0 pg   MCHC 32.8 30.0 - 36.0 g/dL   RDW 13.1 11.5 - 15.5 %   Platelets 318 150 - 400 K/uL  I-stat troponin, ED     Status: None   Collection Time: 03/06/16 11:44 AM  Result Value Ref Range   Troponin i, poc 0.00  0.00 - 0.08 ng/mL   Comment 3            Comment: Due to the release kinetics of cTnI, a negative result within the first hours of the onset of symptoms does not rule out myocardial infarction with certainty. If myocardial infarction is still suspected, repeat the test at appropriate intervals.    Dg Chest 2 View  Result Date: 03/06/2016 CLINICAL DATA:  Chest pain EXAM: CHEST  2 VIEW COMPARISON:  Chest CT 07/10/2008 FINDINGS: Heart and mediastinal contours are within normal limits. No focal opacities or effusions. No acute bony abnormality. IMPRESSION: No active cardiopulmonary disease. Electronically Signed   By: Rolm Baptise M.D.   On: 03/06/2016 12:01    Labs:   Lab Results  Component Value Date   WBC 5.3 03/06/2016   HGB 13.5  03/06/2016   HCT 41.1 03/06/2016   MCV 92.2 03/06/2016   PLT 318 03/06/2016    Recent Labs Lab 03/06/16 1134  NA 140  K 4.4  CL 106  CO2 25  BUN 9  CREATININE 0.74  CALCIUM 9.9  GLUCOSE 129*    EKG: unchanged from previous tracings, LBBB.  Medications Prior to Admission  Medication Sig Dispense Refill  . aspirin 81 MG tablet Take 81 mg by mouth daily.    . carvedilol (COREG) 6.25 MG tablet Take 6.25 mg by mouth 2 (two) times daily.  1  . Cholecalciferol (VITAMIN D) 2000 units CAPS Take 2,000 Units by mouth daily.    . CRESTOR 20 MG tablet Take 20 mg by mouth daily.  3  . ezetimibe (ZETIA) 10 MG tablet Take 10 mg by mouth daily.    Marland Kitchen ibuprofen (ADVIL,MOTRIN) 200 MG tablet Take 200 mg by mouth every 6 (six) hours as needed for moderate pain.    . magnesium 30 MG tablet Take 30 mg by mouth once a week.    . nitroGLYCERIN (NITROSTAT) 0.4 MG SL tablet Place 0.4 mg under the tongue every 5 (five) minutes as needed for chest pain.    . ramipril (ALTACE) 10 MG capsule Take 10 mg by mouth daily.    . rosuvastatin (CRESTOR) 40 MG tablet Take 20 mg by mouth daily.     . ondansetron (ZOFRAN) 4 MG tablet Take 1 tablet (4 mg total) by mouth every 8 (eight) hours as needed for nausea or vomiting. (Patient not taking: Reported on 03/06/2016) 20 tablet 0  . phenazopyridine (PYRIDIUM) 200 MG tablet Take 1 tablet (200 mg total) by mouth 3 (three) times daily. (Patient not taking: Reported on 03/06/2016) 6 tablet 0    Current Facility-Administered Medications:  .  acetaminophen (TYLENOL) tablet 650 mg, 650 mg, Oral, Q4H PRN, Adrian Prows, MD .  aspirin tablet 81 mg, 81 mg, Oral, Daily, Adrian Prows, MD .  carvedilol (COREG) tablet 6.25 mg, 6.25 mg, Oral, BID, Adrian Prows, MD .  ezetimibe (ZETIA) tablet 10 mg, 10 mg, Oral, Daily, Adrian Prows, MD .  magnesium tablet 30 mg, 30 mg, Oral, Weekly, Adrian Prows, MD .  nitroGLYCERIN (NITROSTAT) SL tablet 0.4 mg, 0.4 mg, Sublingual, Q5 Min x 3 PRN, Adrian Prows, MD .   ondansetron [facility]) injection 4 mg, 4 mg, Intravenous, Q6H PRN, Adrian Prows, MD .  ramipril (ALTACE) capsule 10 mg, 10 mg, Oral, Daily, Adrian Prows, MD .  rosuvastatin (CRESTOR) tablet 20 mg, 20 mg, Oral, Daily, Adrian Prows, MD .  rosuvastatin (CRESTOR) tablet 20 mg, 20 mg, Oral, Daily, Adrian Prows, MD .  Vitamin D CAPS 2,000 Units, 2,000 Units, Oral, Daily, Adrian Prows, MD .  zolpidem (AMBIEN) tablet 5 mg, 5 mg, Oral, QHS PRN, Adrian Prows, MD  Assessment/Plan 1. Unstable angina pectoris  2. CAD of the native vessels: H/O Stenting of right coronary artery with 3.0 x 18 mm Cypher drug-eluting stent on 05/16/2003. She has a residual 70% mid to distal LAD stenosis.  3. Hyperlipidemia 4. Family h/o premature CAD 5. Hypertension 6. H/O Prior tobacco use.  Rec: Admit to hospital and r/o for MI. Start IV heparin and if recurrence of chest pain will start IV NTG. Husband present at bedside.  Has known CAD and her symptoms are fairly suggestive of ACS and hence best option is to proceed with coronary angiogram.  Discussed risks, benefits and alternatives of angiogram including but not limited to <1% risk of death, stroke, MI, need for urgent surgical revascularization, renal failure, but not limited to thest. patient is willing to proceed.   Adrian Prows, MD 03/06/2016, 5:07 PM [facility] [place] Pager: [phone] Office: [phone] If no answer: Cell:  [phone]

## 2016-03-06 NOTE — ED Triage Notes (Addendum)
Pt presents for c/o cp. The pain began 7 days ago. She reports dizziness, SOB, and elevated blood pressure readings. She denies fevers, cough. She has been taking her BP medication as prescribed. Her pain has been relieved with nitroglycerin.

## 2016-03-06 NOTE — Progress Notes (Signed)
ANTICOAGULATION CONSULT NOTE - Initial Consult  Pharmacy Consult for heparin Indication: chest pain/ACS  No Known Allergies  Patient Measurements: Height: 5\' 2"  (157.5 cm) Weight: 158 lb (71.7 kg) IBW/kg (Calculated) : 50.1 Heparin Dosing Weight: 65kg  Vital Signs: Temp: 97.9 F (36.6 C) (02/11 1131) Temp Source: Oral (02/11 1131) BP: 144/77 (02/11 1500) Pulse Rate: 64 (02/11 1500)  Labs:  Recent Labs  03/06/16 1134  HGB 13.5  HCT 41.1  PLT 318  CREATININE 0.74    Estimated Creatinine Clearance: 69.3 mL/min (by C-G formula based on SCr of 0.74 mg/dL).   Medical History: Past Medical History:  Diagnosis Date  . High cholesterol   . Hypertension     Medications:  Infusions:  . heparin      Assessment: 61 yof presented to the hospital with CP. Baseline CBC is WNL. Troponin is negative so far. Pt to start IV heparin. She is not on anticoagulation PTA.   Goal of Therapy:  Heparin level 0.3-0.7 units/ml Monitor platelets by anticoagulation protocol: Yes   Plan:  Heparin bolus 4000 units IV x 1 Heparin gtt 800 units/hr Check a 6 hr heparin level Daily heparin level and CBC  Naasir Carreira, Drake Leachachel Lynn 03/06/2016,5:11 PM

## 2016-03-07 ENCOUNTER — Encounter (HOSPITAL_COMMUNITY)
Admission: EM | Disposition: A | Discharge status: Home / Self Care | Payer: Self-pay | Source: Home / Self Care | Attending: Emergency Medicine

## 2016-03-07 DIAGNOSIS — I25110 Atherosclerotic heart disease of native coronary artery with unstable angina pectoris: Secondary | ICD-10-CM | POA: Diagnosis not present

## 2016-03-07 DIAGNOSIS — I447 Left bundle-branch block, unspecified: Secondary | ICD-10-CM | POA: Diagnosis not present

## 2016-03-07 HISTORY — PX: CORONARY ANGIOGRAPHY: CATH118303

## 2016-03-07 HISTORY — PX: ABDOMINAL AORTOGRAM: CATH118222

## 2016-03-07 LAB — POCT ACTIVATED CLOTTING TIME: ACTIVATED CLOTTING TIME: 142 s

## 2016-03-07 LAB — TROPONIN I: Troponin I: <0.03 ng/mL (ref <0.03)

## 2016-03-07 LAB — CBC
HEMATOCRIT: 38.8 % (ref 36.0–46.0)
HEMOGLOBIN: 12.7 g/dL (ref 12.0–15.0)
MCH: 30 pg (ref 26.0–34.0)
MCHC: 32.7 g/dL (ref 30.0–36.0)
MCV: 91.5 fL (ref 78.0–100.0)
PLATELETS: 285 10*3/uL (ref 150–400)
RBC: 4.24 MIL/uL (ref 3.87–5.11)
RDW: 13.4 % (ref 11.5–15.5)
WBC: 6.3 10*3/uL (ref 4.0–10.5)

## 2016-03-07 LAB — HEPARIN LEVEL (UNFRACTIONATED): Heparin Unfractionated: 0.4 IU/mL (ref 0.30–0.70)

## 2016-03-07 SURGERY — CORONARY ANGIOGRAPHY (CATH LAB)
Anesthesia: Moderate Sedation

## 2016-03-07 MED ORDER — HYDROMORPHONE HCL 1 MG/ML IJ SOLN
INTRAMUSCULAR | Status: AC
  Filled 2016-03-07: qty 1

## 2016-03-07 MED ORDER — MIDAZOLAM HCL 2 MG/2ML IJ SOLN
INTRAMUSCULAR | Status: DC | PRN
  Administered 2016-03-07: 2 mg via INTRAVENOUS

## 2016-03-07 MED ORDER — SODIUM CHLORIDE 0.9 % WEIGHT BASED INFUSION: 1.0000 mL/kg/h | INTRAVENOUS | Status: AC

## 2016-03-07 MED ORDER — HYDROMORPHONE HCL 1 MG/ML IJ SOLN
INTRAMUSCULAR | Status: DC | PRN
  Administered 2016-03-07: 0.5 mg via INTRAVENOUS

## 2016-03-07 MED ORDER — LIDOCAINE HCL (PF) 1 % IJ SOLN
INTRAMUSCULAR | Status: DC | PRN
  Administered 2016-03-07: 2 mL
  Administered 2016-03-07: 20 mL

## 2016-03-07 MED ORDER — SODIUM CHLORIDE 0.9 % IV SOLN: 250.0000 mL | INTRAVENOUS | Status: DC | PRN

## 2016-03-07 MED ORDER — AMLODIPINE BESYLATE 10 MG PO TABS
10.0000 mg | ORAL_TABLET | Freq: Every day | ORAL | Status: DC
  Filled 2016-03-07: qty 1

## 2016-03-07 MED ORDER — IOPAMIDOL (ISOVUE-370) INJECTION 76%
INTRAVENOUS | Status: AC
  Filled 2016-03-07: qty 100

## 2016-03-07 MED ORDER — SODIUM CHLORIDE 0.9 % WEIGHT BASED INFUSION
1.0000 mL/kg/h | INTRAVENOUS | Status: DC
  Administered 2016-03-07: 1 mL/kg/h via INTRAVENOUS

## 2016-03-07 MED ORDER — SODIUM CHLORIDE 0.9% FLUSH: 3.0000 mL | INTRAVENOUS | Status: DC | PRN

## 2016-03-07 MED ORDER — VERAPAMIL HCL 2.5 MG/ML IV SOLN
INTRAVENOUS | Status: AC
  Filled 2016-03-07: qty 2

## 2016-03-07 MED ORDER — SODIUM CHLORIDE 0.9% FLUSH
3.0000 mL | Freq: Two times a day (BID) | INTRAVENOUS | Status: DC
  Administered 2016-03-07: 3 mL via INTRAVENOUS

## 2016-03-07 MED ORDER — LIDOCAINE HCL (PF) 1 % IJ SOLN
INTRAMUSCULAR | Status: AC
  Filled 2016-03-07: qty 30

## 2016-03-07 MED ORDER — IOPAMIDOL (ISOVUE-370) INJECTION 76%
INTRAVENOUS | Status: DC | PRN
  Administered 2016-03-07: 90 mL via INTRA_ARTERIAL

## 2016-03-07 MED ORDER — HEPARIN (PORCINE) IN NACL 2-0.9 UNIT/ML-% IJ SOLN
INTRAMUSCULAR | Status: DC | PRN
  Administered 2016-03-07: 1000 mL

## 2016-03-07 MED ORDER — SODIUM CHLORIDE 0.9% FLUSH: 3.0000 mL | Freq: Two times a day (BID) | INTRAVENOUS | Status: DC

## 2016-03-07 MED ORDER — MIDAZOLAM HCL 2 MG/2ML IJ SOLN
INTRAMUSCULAR | Status: AC
  Filled 2016-03-07: qty 2

## 2016-03-07 MED ORDER — HEPARIN (PORCINE) IN NACL 2-0.9 UNIT/ML-% IJ SOLN
INTRAMUSCULAR | Status: AC
  Filled 2016-03-07: qty 1000

## 2016-03-07 MED ORDER — NITROGLYCERIN 1 MG/10 ML FOR IR/CATH LAB
INTRA_ARTERIAL | Status: AC
  Filled 2016-03-07: qty 10

## 2016-03-07 SURGICAL SUPPLY — 11 items
CATH INFINITI 5FR MPB2 (CATHETERS) ×1 IMPLANT
COVER PRB 48X5XTLSCP FOLD TPE (BAG) IMPLANT
COVER PROBE 5X48 (BAG) ×2
GLIDESHEATH SLEND A-KIT 6F 20G (SHEATH) ×1 IMPLANT
KIT HEART LEFT (KITS) ×2 IMPLANT
PACK CARDIAC CATHETERIZATION (CUSTOM PROCEDURE TRAY) ×2 IMPLANT
SET INTRODUCER MICROPUNCT 5F (INTRODUCER) ×1 IMPLANT
SHEATH PINNACLE 5F 10CM (SHEATH) ×1 IMPLANT
TRANSDUCER W/STOPCOCK (MISCELLANEOUS) ×2 IMPLANT
TUBING CIL FLEX 10 FLL-RA (TUBING) ×2 IMPLANT
WIRE EMERALD 3MM-J .035X150CM (WIRE) ×1 IMPLANT

## 2016-03-07 NOTE — Plan of Care (Signed)
Problem: Education: Goal: Knowledge of [facility] Education information/materials will improve Outcome: Progressing Educated pt on scheduled heart cath this afternoon, pt able to return verbal understanding of procedure.

## 2016-03-07 NOTE — Progress Notes (Signed)
ANTICOAGULATION CONSULT NOTE - Follow-up Consult  Pharmacy Consult for heparin Indication: chest pain/ACS  No Known Allergies  Patient Measurements: Height: 5\' 2"  (157.5 cm) Weight: 158 lb (71.7 kg) IBW/kg (Calculated) : 50.1 Heparin Dosing Weight: 65kg  Vital Signs: Temp: 98 F (36.7 C) (02/12 0500) BP: 124/69 (02/12 0500) Pulse Rate: 77 (02/12 0500)  Labs:  Recent Labs  03/06/16 1134 03/06/16 1815 03/06/16 2303 03/07/16 0501 03/07/16 1041  HGB 13.5  --   --  12.7  --   HCT 41.1  --   --  38.8  --   PLT 318  --   --  285  --   LABPROT  --  14.0  --   --   --   INR  --  1.07  --   --   --   HEPARINUNFRC  --   --  0.42  --  0.40  CREATININE 0.74  --   --   --   --   TROPONINI  --  <0.03 <0.03 <0.03  --     Estimated Creatinine Clearance: 69.3 mL/min (by C-G formula based on SCr of 0.74 mg/dL).  Assessment: 61 yof on heparin for r/o ACS. Heparin level therapeutic (0.40) on gtt at 800 units/hr. No bleeding noted. CBC wnl.  Scheduled for cardiac cath today.  Goal of Therapy:  Heparin level 0.3-0.7 units/ml Monitor platelets by anticoagulation protocol: Yes   Plan:  Continue heparin gtt 800 units/hr Daily heparin level and CBC  Tiffany Ryan, RPh Clinical Pharmacist Pager: [phone] [facility] [phone] 03/07/2016,1:33 PM

## 2016-03-07 NOTE — Progress Notes (Addendum)
Site area: RFA Site Prior to Removal:  Level 0 Pressure Applied For: 20 min Manual:  yes  Patient Status During Pull: stable  Post Pull Site:  Level 0 Post Pull Instructions Given:  yes Post Pull Pulses Present: palpable Dressing Applied:  tegaderm Bedrest begins @ 1810 till 2210 Comments:

## 2016-03-07 NOTE — Interval H&P Note (Signed)
History and Physical Interval Note:  03/07/2016 4:30 PM  Tiffany Ryan  has presented today for surgery, with the diagnosis of Unstable angina  The various methods of treatment have been discussed with the patient and family. After consideration of risks, benefits and other options for treatment, the patient has consented to  Procedure(s) Coronary Angiography and possible PCI (N/A).  The patient's history has been reviewed, patient examined, no change in status, stable for surgery.  I have reviewed the patient's chart and labs.  Questions were answered to the patient's satisfaction.   Cath Lab Visit (complete for each Cath Lab visit)  Clinical Evaluation Leading to the Procedure:   ACS: Yes.    Non-ACS:    Anginal Classification: CCS IV  Anti-ischemic medical therapy: Minimal Therapy (1 class of medications)  Non-Invasive Test Results: No non-invasive testing performed  Prior CABG: No previous CABG        Yates DecampGANJI, Makenley Shimp

## 2016-03-08 ENCOUNTER — Encounter (HOSPITAL_COMMUNITY): Payer: Self-pay | Admitting: Cardiology

## 2016-03-08 DIAGNOSIS — E785 Hyperlipidemia, unspecified: Secondary | ICD-10-CM | POA: Diagnosis not present

## 2016-03-08 DIAGNOSIS — I700 Atherosclerosis of aorta: Secondary | ICD-10-CM | POA: Diagnosis not present

## 2016-03-08 DIAGNOSIS — I2584 Coronary atherosclerosis due to calcified coronary lesion: Secondary | ICD-10-CM | POA: Diagnosis not present

## 2016-03-08 DIAGNOSIS — I25110 Atherosclerotic heart disease of native coronary artery with unstable angina pectoris: Secondary | ICD-10-CM | POA: Diagnosis not present

## 2016-03-08 DIAGNOSIS — I447 Left bundle-branch block, unspecified: Secondary | ICD-10-CM | POA: Diagnosis not present

## 2016-03-08 LAB — CBC
HEMATOCRIT: 37.2 % (ref 36.0–46.0)
HEMOGLOBIN: 12 g/dL (ref 12.0–15.0)
MCH: 29.6 pg (ref 26.0–34.0)
MCHC: 32.3 g/dL (ref 30.0–36.0)
MCV: 91.9 fL (ref 78.0–100.0)
Platelets: 256 10*3/uL (ref 150–400)
RBC: 4.05 MIL/uL (ref 3.87–5.11)
RDW: 13.5 % (ref 11.5–15.5)
WBC: 5.2 10*3/uL (ref 4.0–10.5)

## 2016-03-08 MED ORDER — AMLODIPINE BESYLATE 10 MG PO TABS: 10.0000 mg | ORAL_TABLET | Freq: Every day | ORAL | 6 refills | Status: DC

## 2016-03-08 MED FILL — Nitroglycerin IV Soln 100 MCG/ML in D5W: INTRA_ARTERIAL | Qty: 10 | Status: AC

## 2016-03-08 MED FILL — Verapamil HCl IV Soln 2.5 MG/ML: INTRAVENOUS | Qty: 2 | Status: AC

## 2016-03-08 NOTE — Discharge Summary (Signed)
Physician Discharge Summary  Patient ID: Tiffany Ryan First MRN: [id number] DOB/AGE: 60-Nov-1957 60 y.o.  Admit date: 03/06/2016 Discharge date: 03/08/2016  Primary Discharge Diagnosis 1. Angina Pectoris chronic stable 2. Essential hypertension with hypertensive heart disease 3. CAD of the native vessels: H/O Stenting of right coronary artery with 3.0 x 18 mm Cypher drug-eluting stent on 05/16/2003. Diffuse calcific disease severe by angiogram 03/07/2016. 4. Hyperlipidemia 5. H/O prior tobacco use.  Significant Diagnostic Studies: 03/07/2016: Procedures   Coronary Angiography and possible PCI  Abdominal Aortogram  Conclusion   1. Normal LV systolic function, EF 55%. 2. Severe triple-vessel coronary calcification.   3.0 x 18 mm Cypher drug-eluting stent placed on 05/16/2003 in the mid RCA is widely patent. There is mild 10-15% luminal irregularity. Ostium of the right has 10-20% stenosis and right coronary arteries again severely calcified but fairly large size vessel giving origin to large PL and PDA branches. 3. Severe diffuse disease in the LAD, LAD immediately tapers after its origin and is diffusely diseased and calcified. Moderate sized ramus with mild disease and small circumflex with diffuse disease and calcified.  4. Abdominal aortogram: Mild atherosclerotic narrowing of the abdominal aorta in the distal segment. 2 renal arteries one on either sides widely patent. No evidence of abdominal aortic aneurysm. Aortoiliac bifurcation widely patent.   Hospital Course: Admitted with symptoms suggestive of unstable angina. Started on IV heparin, troponin negative. Underwent coronary angiogram following day and felt stable discharge today.  Recommendations on discharge: Her symptoms of chest pain may be related to hypertension and hypertensive heart disease and also related to severe small vessel coronary artery disease. I will add amlodipine to her present medical regimen, we will discharge her in  the morning. I'll follow her up in the outpatient basis.  Discharge Exam: Blood pressure 122/69, pulse 66, temperature 97.8 F (36.6 C), temperature source Oral, resp. rate 15, height 5\' 2"  (1.575 m), weight 159 lb 6.4 oz (72.3 kg), SpO2 97 %.  General appearance: alert, cooperative, appears stated age and no distress Eyes: negative findings: lids and lashes normal Neck: no adenopathy, no JVD, supple, symmetrical, trachea midline, thyroid not enlarged, symmetric, no tenderness/mass/nodules and bilateral carotid bruit soft present Neck: JVP - normal, carotids 2+= without bruits Resp: clear to auscultation bilaterally Chest wall: no tenderness Cardio: regular rate and rhythm, S1, S2 normal, no murmur, click, rub or gallop GI: soft, non-tender; bowel sounds normal; no masses,  no organomegaly Extremities: extremities normal, atraumatic, no cyanosis or edema Pulses: 2+ and symmetric. Skin: Skin color, texture, turgor normal. No rashes or lesions Neurologic: Grossly normal Right groin access site without hematoma or bruising.  Labs:   Lab Results  Component Value Date   WBC 5.2 03/08/2016   HGB 12.0 03/08/2016   HCT 37.2 03/08/2016   MCV 91.9 03/08/2016   PLT 256 03/08/2016    Recent Labs Lab 03/06/16 1134  NA 140  K 4.4  CL 106  CO2 25  BUN 9  CREATININE 0.74  CALCIUM 9.9  GLUCOSE 129*   Recent Labs  03/06/16 1815 03/06/16 2303 03/07/16 0501  TROPONINI <0.03 <0.03 <0.03    Lab Results  Component Value Date   TROPONINI <0.03 03/07/2016     Radiology: Dg Chest 2 View  Result Date: 03/06/2016 CLINICAL DATA:  Chest pain EXAM: CHEST  2 VIEW COMPARISON:  Chest CT 07/10/2008 FINDINGS: Heart and mediastinal contours are within normal limits. No focal opacities or effusions. No acute bony abnormality. IMPRESSION: No active cardiopulmonary disease.  Electronically Signed   By: Charlett Nose M.D.   On: 03/06/2016 12:01   FOLLOW UP PLANS AND APPOINTMENTS Discharge  Instructions    Diet - low sodium heart healthy    Complete by:  As directed    Discharge instructions    Complete by:  As directed    If BP not controlled or recurrent chest pain to contact Dr. Jacinto Halim   Increase activity slowly    Complete by:  As directed      Allergies as of 03/08/2016   No Known Allergies     Medication List    TAKE these medications   amLODipine 10 MG tablet Commonly known as:  NORVASC Take 1 tablet (10 mg total) by mouth daily.   aspirin 81 MG tablet Take 81 mg by mouth daily.   carvedilol 6.25 MG tablet Commonly known as:  COREG Take 6.25 mg by mouth 2 (two) times daily.   ezetimibe 10 MG tablet Commonly known as:  ZETIA Take 10 mg by mouth daily.   ibuprofen 200 MG tablet Commonly known as:  ADVIL,MOTRIN Take 200 mg by mouth every 6 (six) hours as needed for moderate pain.   magnesium 30 MG tablet Take 30 mg by mouth once a week.   nitroGLYCERIN 0.4 MG SL tablet Commonly known as:  NITROSTAT Place 0.4 mg under the tongue every 5 (five) minutes as needed for chest pain.   ondansetron 4 MG tablet Commonly known as:  ZOFRAN Take 1 tablet (4 mg total) by mouth every 8 (eight) hours as needed for nausea or vomiting.   phenazopyridine 200 MG tablet Commonly known as:  PYRIDIUM Take 1 tablet (200 mg total) by mouth 3 (three) times daily.   ramipril 10 MG capsule Commonly known as:  ALTACE Take 10 mg by mouth daily.   rosuvastatin 40 MG tablet Commonly known as:  CRESTOR Take 20 mg by mouth daily.   CRESTOR 20 MG tablet Generic drug:  rosuvastatin Take 20 mg by mouth daily.   Vitamin D 2000 units Caps Take 2,000 Units by mouth daily.      Follow-up Information    Yates Decamp, MD Follow up.   Specialty:  Cardiology Why:  Keep previous appointment Contact information: [address] [place] [address] [phone]            Yates Decamp, MD 03/08/2016, 10:16 AM  Pager: [phone] Office:  [phone] If no answer: [phone]

## 2016-04-20 DIAGNOSIS — R51 Headache: Secondary | ICD-10-CM | POA: Diagnosis not present

## 2016-04-20 DIAGNOSIS — E7800 Pure hypercholesterolemia, unspecified: Secondary | ICD-10-CM | POA: Diagnosis not present

## 2016-04-20 DIAGNOSIS — I10 Essential (primary) hypertension: Secondary | ICD-10-CM | POA: Diagnosis not present

## 2016-04-20 DIAGNOSIS — R5383 Other fatigue: Secondary | ICD-10-CM | POA: Diagnosis not present

## 2016-04-29 DIAGNOSIS — R7301 Impaired fasting glucose: Secondary | ICD-10-CM | POA: Diagnosis not present

## 2016-09-19 DIAGNOSIS — R110 Nausea: Secondary | ICD-10-CM | POA: Diagnosis not present

## 2016-09-19 DIAGNOSIS — R1084 Generalized abdominal pain: Secondary | ICD-10-CM | POA: Diagnosis not present

## 2016-09-19 DIAGNOSIS — R634 Abnormal weight loss: Secondary | ICD-10-CM | POA: Diagnosis not present

## 2016-09-21 DIAGNOSIS — R1084 Generalized abdominal pain: Secondary | ICD-10-CM | POA: Diagnosis not present

## 2016-09-21 DIAGNOSIS — R110 Nausea: Secondary | ICD-10-CM | POA: Diagnosis not present

## 2016-11-01 DIAGNOSIS — R634 Abnormal weight loss: Secondary | ICD-10-CM | POA: Diagnosis not present

## 2016-11-01 DIAGNOSIS — R1084 Generalized abdominal pain: Secondary | ICD-10-CM | POA: Diagnosis not present

## 2016-11-01 DIAGNOSIS — R110 Nausea: Secondary | ICD-10-CM | POA: Diagnosis not present

## 2016-11-01 DIAGNOSIS — K5900 Constipation, unspecified: Secondary | ICD-10-CM | POA: Diagnosis not present

## 2016-11-24 DIAGNOSIS — R102 Pelvic and perineal pain: Secondary | ICD-10-CM | POA: Diagnosis not present

## 2016-11-24 DIAGNOSIS — K6289 Other specified diseases of anus and rectum: Secondary | ICD-10-CM | POA: Diagnosis not present

## 2016-11-24 DIAGNOSIS — K648 Other hemorrhoids: Secondary | ICD-10-CM | POA: Diagnosis not present

## 2016-11-30 DIAGNOSIS — R102 Pelvic and perineal pain: Secondary | ICD-10-CM | POA: Diagnosis not present

## 2016-12-10 DIAGNOSIS — R300 Dysuria: Secondary | ICD-10-CM | POA: Diagnosis not present

## 2016-12-10 DIAGNOSIS — N390 Urinary tract infection, site not specified: Secondary | ICD-10-CM | POA: Diagnosis not present

## 2016-12-29 ENCOUNTER — Other Ambulatory Visit: Payer: Self-pay | Admitting: Nurse Practitioner

## 2016-12-29 ENCOUNTER — Other Ambulatory Visit (HOSPITAL_COMMUNITY)
Admission: RE | Admit: 2016-12-29 | Discharge: 2016-12-29 | Disposition: A | Discharge status: Home / Self Care | Payer: BLUE CROSS/BLUE SHIELD | Source: Ambulatory Visit | Attending: Nurse Practitioner | Admitting: Nurse Practitioner

## 2016-12-29 DIAGNOSIS — Z01419 Encounter for gynecological examination (general) (routine) without abnormal findings: Secondary | ICD-10-CM | POA: Insufficient documentation

## 2016-12-29 DIAGNOSIS — N870 Mild cervical dysplasia: Secondary | ICD-10-CM | POA: Diagnosis not present

## 2016-12-29 DIAGNOSIS — Z1231 Encounter for screening mammogram for malignant neoplasm of breast: Secondary | ICD-10-CM

## 2016-12-31 LAB — CYTOLOGY - PAP
HPV 16/18/45 GENOTYPING: NEGATIVE
HPV: DETECTED — AB

## 2017-02-01 ENCOUNTER — Ambulatory Visit
Admission: RE | Admit: 2017-02-01 | Discharge: 2017-02-01 | Disposition: A | Discharge status: Home / Self Care | Payer: BLUE CROSS/BLUE SHIELD | Source: Ambulatory Visit | Attending: Nurse Practitioner | Admitting: Nurse Practitioner

## 2017-02-01 DIAGNOSIS — Z1231 Encounter for screening mammogram for malignant neoplasm of breast: Secondary | ICD-10-CM | POA: Diagnosis not present

## 2017-02-07 DIAGNOSIS — Z0000 Encounter for general adult medical examination without abnormal findings: Secondary | ICD-10-CM | POA: Diagnosis not present

## 2017-02-07 DIAGNOSIS — R739 Hyperglycemia, unspecified: Secondary | ICD-10-CM | POA: Diagnosis not present

## 2017-02-07 DIAGNOSIS — I10 Essential (primary) hypertension: Secondary | ICD-10-CM | POA: Diagnosis not present

## 2017-02-07 DIAGNOSIS — E7800 Pure hypercholesterolemia, unspecified: Secondary | ICD-10-CM | POA: Diagnosis not present

## 2017-02-07 DIAGNOSIS — R102 Pelvic and perineal pain: Secondary | ICD-10-CM | POA: Diagnosis not present

## 2017-02-09 ENCOUNTER — Other Ambulatory Visit: Payer: Self-pay | Admitting: Nurse Practitioner

## 2017-02-09 DIAGNOSIS — N72 Inflammatory disease of cervix uteri: Secondary | ICD-10-CM | POA: Diagnosis not present

## 2017-02-09 DIAGNOSIS — R87612 Low grade squamous intraepithelial lesion on cytologic smear of cervix (LGSIL): Secondary | ICD-10-CM | POA: Diagnosis not present

## 2017-03-03 DIAGNOSIS — Z9861 Coronary angioplasty status: Secondary | ICD-10-CM | POA: Diagnosis not present

## 2017-03-03 DIAGNOSIS — E7800 Pure hypercholesterolemia, unspecified: Secondary | ICD-10-CM | POA: Diagnosis not present

## 2017-03-03 DIAGNOSIS — I447 Left bundle-branch block, unspecified: Secondary | ICD-10-CM | POA: Diagnosis not present

## 2017-03-03 DIAGNOSIS — I2510 Atherosclerotic heart disease of native coronary artery without angina pectoris: Secondary | ICD-10-CM | POA: Diagnosis not present

## 2017-07-14 DIAGNOSIS — I10 Essential (primary) hypertension: Secondary | ICD-10-CM | POA: Diagnosis not present

## 2017-07-14 DIAGNOSIS — E7800 Pure hypercholesterolemia, unspecified: Secondary | ICD-10-CM | POA: Diagnosis not present

## 2017-08-31 ENCOUNTER — Other Ambulatory Visit: Payer: Self-pay | Admitting: Nurse Practitioner

## 2017-08-31 ENCOUNTER — Other Ambulatory Visit (HOSPITAL_COMMUNITY)
Admission: RE | Admit: 2017-08-31 | Discharge: 2017-08-31 | Disposition: A | Discharge status: Home / Self Care | Payer: BLUE CROSS/BLUE SHIELD | Source: Ambulatory Visit | Attending: Nurse Practitioner | Admitting: Nurse Practitioner

## 2017-08-31 DIAGNOSIS — R87619 Unspecified abnormal cytological findings in specimens from cervix uteri: Secondary | ICD-10-CM | POA: Insufficient documentation

## 2017-08-31 DIAGNOSIS — R102 Pelvic and perineal pain: Secondary | ICD-10-CM | POA: Diagnosis not present

## 2017-08-31 DIAGNOSIS — R300 Dysuria: Secondary | ICD-10-CM | POA: Diagnosis not present

## 2017-09-06 LAB — CYTOLOGY - PAP
Diagnosis: UNDETERMINED — AB
HPV (WINDOPATH): DETECTED — AB

## 2018-01-11 DIAGNOSIS — J029 Acute pharyngitis, unspecified: Secondary | ICD-10-CM | POA: Diagnosis not present

## 2018-02-08 DIAGNOSIS — R490 Dysphonia: Secondary | ICD-10-CM | POA: Diagnosis not present

## 2018-02-16 DIAGNOSIS — Z87891 Personal history of nicotine dependence: Secondary | ICD-10-CM | POA: Diagnosis not present

## 2018-02-16 DIAGNOSIS — K219 Gastro-esophageal reflux disease without esophagitis: Secondary | ICD-10-CM | POA: Diagnosis not present

## 2018-02-16 DIAGNOSIS — J383 Other diseases of vocal cords: Secondary | ICD-10-CM | POA: Diagnosis not present

## 2018-03-07 ENCOUNTER — Ambulatory Visit (INDEPENDENT_AMBULATORY_CARE_PROVIDER_SITE_OTHER): Payer: BC Managed Care – PPO | Admitting: Cardiology

## 2018-03-07 ENCOUNTER — Encounter: Payer: Self-pay | Admitting: Cardiology

## 2018-03-07 VITALS — BP 130/78 | HR 74 | Ht 63.0 in | Wt 162.0 lb

## 2018-03-07 DIAGNOSIS — I6521 Occlusion and stenosis of right carotid artery: Secondary | ICD-10-CM

## 2018-03-07 DIAGNOSIS — I2510 Atherosclerotic heart disease of native coronary artery without angina pectoris: Secondary | ICD-10-CM

## 2018-03-07 DIAGNOSIS — E785 Hyperlipidemia, unspecified: Secondary | ICD-10-CM

## 2018-03-07 DIAGNOSIS — I447 Left bundle-branch block, unspecified: Secondary | ICD-10-CM

## 2018-03-07 NOTE — Progress Notes (Signed)
Subjective:   Tiffany Ryan, female    DOB: [birth year], 63 y.o.   MRN: [id number]  Marden Noble, MD:  Chief Complaint  Patient presents with  . Coronary Artery Disease    1 YEAR F/U   . Hyperlipidemia    HPI   Caucasian female from [place] who has history of known coronary artery disease S/P 3.0 x 18 mm Cypher drug-eluting stent placed on 05/16/2003. She has a residual 70% mid to distal LAD stenosis. She also has history of hypertension, hyperlipidemia and a very strong family history of premature coronary artery disease.  She is here on 1 year office visit for CAD and hyperlipidemia. States that she is doing well without any significant complaints today. Does report that in Sept had some chest pain that radiated to her arms, shoulders, and head. Occurred while driving and resolved after a day of resting. She reports fatigue and weakness for 1 month after that. She has not had any recent episodes of chest pain. She has not had to use any nitroglycerin. She continues to exercise regularly with yoga that she tolerates well.   Blood pressure has been well controlled. Last labs were performed approximatley 1 year ago. She was previously on Crestor 40mg  daily; however, developed myalgia symptoms with this and she is now taking 1/3 a tablet daily. She does not want to be on PCSK9 inhibitors.  Past Medical History:  Diagnosis Date  . High cholesterol   . Hypertension     Past Surgical History:  Procedure Laterality Date  . ABDOMINAL AORTOGRAM N/A 03/07/2016   Procedure: Abdominal Aortogram;  Surgeon: Yates Decamp, MD;  Location: [facility] INVASIVE CV LAB;  Service: Cardiovascular;  Laterality: N/A;  . CARDIAC SURGERY     stent x 2  . CORONARY ANGIOGRAPHY N/A 03/07/2016   Procedure: Coronary Angiography and possible PCI;  Surgeon: Yates Decamp, MD;  Location: [facility] INVASIVE CV LAB;  Service: Cardiovascular;  Laterality: N/A;  07:30 Case please    Family History  Problem Relation Age of Onset  .  Stroke Father     Social History   Socioeconomic History  . Marital status: Married    Spouse name: Not on file  . Number of children: Not on file  . Years of education: Not on file  . Highest education level: Not on file  Occupational History  . Not on file  Social Needs  . Financial resource strain: Not on file  . Food insecurity:    Worry: Not on file    Inability: Not on file  . Transportation needs:    Medical: Not on file    Non-medical: Not on file  Tobacco Use  . Smoking status: Former Smoker    Packs/day: 1.00    Years: 25.00    Pack years: 25.00    Types: Cigarettes    Last attempt to quit: 2006    Years since quitting: 14.1  . Smokeless tobacco: Never Used  Substance and Sexual Activity  . Alcohol use: Yes    Alcohol/week: 1.0 standard drinks    Types: 1 Glasses of wine per week    Comment: OCCASSIONAL  . Drug use: No  . Sexual activity: Not on file  Lifestyle  . Physical activity:    Days per week: Not on file    Minutes per session: Not on file  . Stress: Not on file  Relationships  . Social connections:    Talks on phone: Not on file  Gets together: Not on file    Attends religious service: Not on file    Active member of club or organization: Not on file    Attends meetings of clubs or organizations: Not on file    Relationship status: Not on file  . Intimate partner violence:    Fear of current or ex partner: Not on file    Emotionally abused: Not on file    Physically abused: Not on file    Forced sexual activity: Not on file  Other Topics Concern  . Not on file  Social History Narrative  . Not on file    Current Meds  Medication Sig  . aspirin 81 MG tablet Take 81 mg by mouth daily.  . carvedilol (COREG) 6.25 MG tablet Take 6.25 mg by mouth 2 (two) times daily.  . Cholecalciferol (VITAMIN D) 2000 units CAPS Take 2,000 Units by mouth daily.  . CRESTOR 20 MG tablet Take 20 mg by mouth daily.  Marland Kitchen ibuprofen (ADVIL,MOTRIN) 200 MG tablet  Take 200 mg by mouth every 6 (six) hours as needed for moderate pain.  . magnesium 30 MG tablet Take 30 mg by mouth once a week.  . nitroGLYCERIN (NITROSTAT) 0.4 MG SL tablet Place 0.4 mg under the tongue every 5 (five) minutes as needed for chest pain.  . ramipril (ALTACE) 10 MG capsule Take 10 mg by mouth daily.     Review of Systems  Constitution: Negative for decreased appetite, malaise/fatigue, weight gain and weight loss.  Eyes: Negative for visual disturbance.  Cardiovascular: Negative for chest pain, claudication, dyspnea on exertion, leg swelling, orthopnea, palpitations and syncope.  Respiratory: Negative for hemoptysis and wheezing.   Endocrine: Negative for cold intolerance and heat intolerance.  Hematologic/Lymphatic: Does not bruise/bleed easily.  Skin: Negative for nail changes.  Musculoskeletal: Positive for myalgias (muscle aches mostly in her legs; unchanged). Negative for muscle weakness.  Gastrointestinal: Negative for abdominal pain, change in bowel habit, nausea and vomiting.  Neurological: Negative for difficulty with concentration, dizziness, focal weakness and headaches.  Psychiatric/Behavioral: Negative for altered mental status and suicidal ideas.  All other systems reviewed and are negative.      Objective:     Blood pressure 130/78, pulse 74, height 5\' 3"  (1.6 m), weight 162 lb (73.5 kg), SpO2 96 %.  Coronary angiogram 03/07/2016: Severe diffuse calcific disease in the LAD and Cx. Previous stent to right coronary artery (3.0 x 18 mm Cypher drug-eluting stent on 05/16/2003) patent with 20% ISR. Normal LVEF.  Carotid artery duplex 03/01/2016: Stenosis in the right internal carotid artery (16-49%), in the upper end of spectrum. Antegrade vertebral artery flow. Follow up in one year is appropriate if clinically indicated. No significant change from 01/03/2013.  Stress test 03/08/2015: Patient exercised on Bruce protocol for 9 minutes and 28 seconds and  achieved 106% of MPHR.  Occasional PVCs during stress and recovery period, underlying LBBB.Marland Kitchen  Normal blood pressure response.  Poststress LVEF 60%. without additional wall motion abnormality.  Physical Exam  Constitutional: She is oriented to person, place, and time. Vital signs are normal. She appears well-developed and well-nourished.  HENT:  Head: Normocephalic and atraumatic.  Neck: Normal range of motion.  Cardiovascular: Normal rate, regular rhythm, normal heart sounds and intact distal pulses.  Pulses:      Carotid pulses are on the right side with bruit and on the left side with bruit.      Femoral pulses are 2+ on the right side and 2+  on the left side.      Popliteal pulses are 2+ on the right side and 2+ on the left side.       Dorsalis pedis pulses are 1+ on the right side and 1+ on the left side.       Posterior tibial pulses are 0 on the right side and 0 on the left side.  Pulmonary/Chest: Effort normal and breath sounds normal. No accessory muscle usage. No respiratory distress.  Abdominal: Soft. Bowel sounds are normal.  Musculoskeletal: Normal range of motion.  Neurological: She is alert and oriented to person, place, and time.  Skin: Skin is warm and dry.  Vitals reviewed.      Assessment & Recommendations:   1. Coronary artery disease involving native coronary artery of native heart without angina pectoris Reports one episode of chest discomfort several months ago; however, has not had any recent episodes. No changes noted to EKG. Will continue to monitor. Encouraged her to continue to regularly exercise. Blood pressure is well controlled and she is on appropriate medical therapy.  2. Asymptomatic stenosis of right carotid artery Unchanged by physical exam and remains asymptomatic. Would recommend repeat carotid duplex for surveillance and will notify her of the results.  3. Left bundle branch block Has known LBBB.  4. Hyperlipidemia, unspecified hyperlipidemia  type By last lipid panel in June 2019, lipids continue to be uncontrolled. I have again discussed indication for PCSK9 inhibitor, but she continues to wish to not start. Will continue with Crestor as is currently taking. She is aware of risk of uncontrolled lipids.  Encouraged her to contact us for any reoccurrence of chest pain or for any new or worsening problems. Will see her back in 1 year for follow up on CAD and HLD.   Altamese CarolinaAshton Loman Logan, FNP-C [facility] Office: [phone] Fax: [phone]

## 2018-03-13 ENCOUNTER — Other Ambulatory Visit: Payer: Self-pay | Admitting: Nurse Practitioner

## 2018-03-13 ENCOUNTER — Other Ambulatory Visit (HOSPITAL_COMMUNITY)
Admission: RE | Admit: 2018-03-13 | Discharge: 2018-03-13 | Disposition: A | Discharge status: Home / Self Care | Payer: BLUE CROSS/BLUE SHIELD | Source: Ambulatory Visit | Attending: Nurse Practitioner | Admitting: Nurse Practitioner

## 2018-03-13 DIAGNOSIS — B977 Papillomavirus as the cause of diseases classified elsewhere: Secondary | ICD-10-CM | POA: Insufficient documentation

## 2018-03-13 DIAGNOSIS — R87619 Unspecified abnormal cytological findings in specimens from cervix uteri: Secondary | ICD-10-CM | POA: Insufficient documentation

## 2018-03-16 LAB — CYTOLOGY - PAP
DIAGNOSIS: UNDETERMINED — AB
HPV: DETECTED — AB

## 2018-04-24 ENCOUNTER — Other Ambulatory Visit: Payer: Self-pay | Admitting: Nurse Practitioner

## 2018-04-24 DIAGNOSIS — N72 Inflammatory disease of cervix uteri: Secondary | ICD-10-CM | POA: Diagnosis not present

## 2018-04-24 DIAGNOSIS — N870 Mild cervical dysplasia: Secondary | ICD-10-CM | POA: Diagnosis not present

## 2018-04-24 DIAGNOSIS — N879 Dysplasia of cervix uteri, unspecified: Secondary | ICD-10-CM | POA: Diagnosis not present

## 2018-04-25 HISTORY — PX: COLPOSCOPY: SHX161

## 2018-05-08 DIAGNOSIS — B977 Papillomavirus as the cause of diseases classified elsewhere: Secondary | ICD-10-CM | POA: Diagnosis not present

## 2018-05-08 DIAGNOSIS — R87619 Unspecified abnormal cytological findings in specimens from cervix uteri: Secondary | ICD-10-CM | POA: Diagnosis not present

## 2018-05-16 ENCOUNTER — Other Ambulatory Visit: Payer: Self-pay | Admitting: Obstetrics & Gynecology

## 2018-05-16 DIAGNOSIS — N72 Inflammatory disease of cervix uteri: Secondary | ICD-10-CM | POA: Diagnosis not present

## 2018-05-29 ENCOUNTER — Other Ambulatory Visit: Payer: BLUE CROSS/BLUE SHIELD

## 2018-06-06 ENCOUNTER — Observation Stay (HOSPITAL_COMMUNITY)
Admission: AD | Admit: 2018-06-06 | Discharge: 2018-06-07 | DRG: 287 | Disposition: A | Discharge status: Home / Self Care | Payer: BLUE CROSS/BLUE SHIELD | Source: Ambulatory Visit | Attending: Cardiology | Admitting: Cardiology

## 2018-06-06 ENCOUNTER — Other Ambulatory Visit: Payer: Self-pay

## 2018-06-06 ENCOUNTER — Encounter: Payer: Self-pay | Admitting: Cardiology

## 2018-06-06 ENCOUNTER — Other Ambulatory Visit: Payer: Self-pay | Admitting: Cardiology

## 2018-06-06 ENCOUNTER — Ambulatory Visit (INDEPENDENT_AMBULATORY_CARE_PROVIDER_SITE_OTHER): Payer: BC Managed Care – PPO | Admitting: Cardiology

## 2018-06-06 ENCOUNTER — Telehealth: Payer: Self-pay

## 2018-06-06 VITALS — BP 177/88 | HR 74 | Temp 98.1°F | Ht 62.0 in | Wt 164.0 lb

## 2018-06-06 DIAGNOSIS — I25110 Atherosclerotic heart disease of native coronary artery with unstable angina pectoris: Secondary | ICD-10-CM

## 2018-06-06 DIAGNOSIS — I200 Unstable angina: Secondary | ICD-10-CM | POA: Diagnosis present

## 2018-06-06 DIAGNOSIS — I10 Essential (primary) hypertension: Secondary | ICD-10-CM | POA: Diagnosis not present

## 2018-06-06 DIAGNOSIS — I2584 Coronary atherosclerosis due to calcified coronary lesion: Secondary | ICD-10-CM | POA: Insufficient documentation

## 2018-06-06 DIAGNOSIS — I447 Left bundle-branch block, unspecified: Secondary | ICD-10-CM

## 2018-06-06 DIAGNOSIS — R7303 Prediabetes: Secondary | ICD-10-CM | POA: Diagnosis present

## 2018-06-06 DIAGNOSIS — E7800 Pure hypercholesterolemia, unspecified: Secondary | ICD-10-CM | POA: Diagnosis not present

## 2018-06-06 DIAGNOSIS — E782 Mixed hyperlipidemia: Secondary | ICD-10-CM

## 2018-06-06 DIAGNOSIS — Z955 Presence of coronary angioplasty implant and graft: Secondary | ICD-10-CM

## 2018-06-06 DIAGNOSIS — E785 Hyperlipidemia, unspecified: Secondary | ICD-10-CM | POA: Diagnosis present

## 2018-06-06 DIAGNOSIS — Z7982 Long term (current) use of aspirin: Secondary | ICD-10-CM | POA: Diagnosis not present

## 2018-06-06 DIAGNOSIS — I25118 Atherosclerotic heart disease of native coronary artery with other forms of angina pectoris: Secondary | ICD-10-CM

## 2018-06-06 DIAGNOSIS — Z8249 Family history of ischemic heart disease and other diseases of the circulatory system: Secondary | ICD-10-CM

## 2018-06-06 DIAGNOSIS — Z79899 Other long term (current) drug therapy: Secondary | ICD-10-CM

## 2018-06-06 DIAGNOSIS — Z87891 Personal history of nicotine dependence: Secondary | ICD-10-CM | POA: Diagnosis not present

## 2018-06-06 DIAGNOSIS — Z1159 Encounter for screening for other viral diseases: Secondary | ICD-10-CM | POA: Diagnosis not present

## 2018-06-06 DIAGNOSIS — R079 Chest pain, unspecified: Secondary | ICD-10-CM | POA: Diagnosis not present

## 2018-06-06 LAB — BASIC METABOLIC PANEL
Anion gap: 11 (ref 5–15)
BUN: 10 mg/dL (ref 8–23)
CO2: 25 mmol/L (ref 22–32)
Calcium: 9.6 mg/dL (ref 8.9–10.3)
Chloride: 105 mmol/L (ref 98–111)
Creatinine, Ser: 0.69 mg/dL (ref 0.44–1.00)
GFR calc Af Amer: >60 mL/min (ref >60)
GFR calc non Af Amer: >60 mL/min (ref >60)
Glucose, Bld: 135 mg/dL — ABNORMAL HIGH (ref 70–99)
Potassium: 4.4 mmol/L (ref 3.5–5.1)
Sodium: 141 mmol/L (ref 135–145)

## 2018-06-06 LAB — CBC
HCT: 41.4 % (ref 36.0–46.0)
Hemoglobin: 13.7 g/dL (ref 12.0–15.0)
MCH: 30.5 pg (ref 26.0–34.0)
MCHC: 33.1 g/dL (ref 30.0–36.0)
MCV: 92.2 fL (ref 80.0–100.0)
Platelets: 359 10*3/uL (ref 150–400)
RBC: 4.49 MIL/uL (ref 3.87–5.11)
RDW: 12.4 % (ref 11.5–15.5)
WBC: 6.9 10*3/uL (ref 4.0–10.5)
nRBC: 0 % (ref 0.0–0.2)

## 2018-06-06 LAB — PROTIME-INR
INR: 1 (ref 0.8–1.2)
Prothrombin Time: 13.3 seconds (ref 11.4–15.2)

## 2018-06-06 LAB — LIPID PANEL
Cholesterol: 228 mg/dL — ABNORMAL HIGH (ref 0–200)
HDL: 43 mg/dL (ref >40)
LDL Cholesterol: 154 mg/dL — ABNORMAL HIGH (ref 0–99)
Total CHOL/HDL Ratio: 5.3 RATIO
Triglycerides: 156 mg/dL — ABNORMAL HIGH (ref <150)
VLDL: 31 mg/dL (ref 0–40)

## 2018-06-06 LAB — SARS CORONAVIRUS 2 BY RT PCR (HOSPITAL ORDER, PERFORMED IN ~~LOC~~ HOSPITAL LAB): SARS Coronavirus 2: NEGATIVE

## 2018-06-06 LAB — HEMOGLOBIN A1C
Hgb A1c MFr Bld: 6.2 % — ABNORMAL HIGH (ref 4.8–5.6)
Mean Plasma Glucose: 131.24 mg/dL

## 2018-06-06 LAB — APTT: aPTT: 32 seconds (ref 24–36)

## 2018-06-06 LAB — TSH: TSH: 2.064 u[IU]/mL (ref 0.350–4.500)

## 2018-06-06 LAB — HEPARIN LEVEL (UNFRACTIONATED): Heparin Unfractionated: 0.5 IU/mL (ref 0.30–0.70)

## 2018-06-06 LAB — TROPONIN I: Troponin I: <0.03 ng/mL (ref <0.03)

## 2018-06-06 MED ORDER — ATORVASTATIN CALCIUM 80 MG PO TABS
80.0000 mg | ORAL_TABLET | Freq: Every day | ORAL | Status: DC
  Administered 2018-06-06: 17:00:00 80 mg via ORAL
  Filled 2018-06-06: qty 1

## 2018-06-06 MED ORDER — SODIUM CHLORIDE 0.9 % IV SOLN: 250.0000 mL | INTRAVENOUS | Status: DC | PRN

## 2018-06-06 MED ORDER — ASPIRIN EC 81 MG PO TBEC
81.0000 mg | DELAYED_RELEASE_TABLET | Freq: Every day | ORAL | Status: DC
  Administered 2018-06-07 (×2): 81 mg via ORAL
  Filled 2018-06-06 (×2): qty 1

## 2018-06-06 MED ORDER — SODIUM CHLORIDE 0.9 % WEIGHT BASED INFUSION: 1.0000 mL/kg/h | INTRAVENOUS | Status: DC

## 2018-06-06 MED ORDER — NITROGLYCERIN 0.4 MG SL SUBL
0.4000 mg | SUBLINGUAL_TABLET | SUBLINGUAL | Status: DC | PRN
  Administered 2018-06-06 (×3): 0.4 mg via SUBLINGUAL
  Filled 2018-06-06: qty 1

## 2018-06-06 MED ORDER — ONDANSETRON HCL 4 MG/2ML IJ SOLN: 4.0000 mg | Freq: Four times a day (QID) | INTRAMUSCULAR | Status: DC | PRN

## 2018-06-06 MED ORDER — SODIUM CHLORIDE 0.9 % WEIGHT BASED INFUSION
3.0000 mL/kg/h | INTRAVENOUS | Status: AC
  Administered 2018-06-07: 04:00:00 3 mL/kg/h via INTRAVENOUS

## 2018-06-06 MED ORDER — ASPIRIN 300 MG RE SUPP: 300.0000 mg | RECTAL | Status: AC

## 2018-06-06 MED ORDER — ACETAMINOPHEN 325 MG PO TABS: 650.0000 mg | ORAL_TABLET | ORAL | Status: DC | PRN

## 2018-06-06 MED ORDER — HEPARIN BOLUS VIA INFUSION
4000.0000 [IU] | Freq: Once | INTRAVENOUS | Status: AC
  Administered 2018-06-06: 4000 [IU] via INTRAVENOUS
  Filled 2018-06-06: qty 4000

## 2018-06-06 MED ORDER — SODIUM CHLORIDE 0.9% FLUSH
3.0000 mL | Freq: Two times a day (BID) | INTRAVENOUS | Status: DC
  Administered 2018-06-06: 3 mL via INTRAVENOUS

## 2018-06-06 MED ORDER — HEPARIN (PORCINE) 25000 UT/250ML-% IV SOLN
800.0000 [IU]/h | INTRAVENOUS | Status: DC
  Administered 2018-06-06: 19:00:00 800 [IU]/h via INTRAVENOUS
  Filled 2018-06-06: qty 250

## 2018-06-06 MED ORDER — SODIUM CHLORIDE 0.9% FLUSH: 3.0000 mL | INTRAVENOUS | Status: DC | PRN

## 2018-06-06 MED ORDER — METOPROLOL SUCCINATE ER 25 MG PO TB24
25.0000 mg | ORAL_TABLET | Freq: Every day | ORAL | Status: DC
  Administered 2018-06-06: 20:00:00 25 mg via ORAL
  Filled 2018-06-06 (×2): qty 1

## 2018-06-06 MED ORDER — ASPIRIN 81 MG PO CHEW
324.0000 mg | CHEWABLE_TABLET | ORAL | Status: AC
  Administered 2018-06-06: 17:00:00 324 mg via ORAL
  Filled 2018-06-06: qty 4

## 2018-06-06 MED ORDER — NITROGLYCERIN IN D5W 200-5 MCG/ML-% IV SOLN
0.0000 ug/min | INTRAVENOUS | Status: DC
  Administered 2018-06-06: 19:00:00 10 ug/min via INTRAVENOUS
  Filled 2018-06-06: qty 250

## 2018-06-06 NOTE — Telephone Encounter (Signed)
Pt called c/o lots of cp, shoulders, neck; This started 2 weeks ago, it comes and goes; Pt has take a nitro and she says that it helps with the chest pain; Pt wants to be seen urgent; Please advise   [phone]

## 2018-06-06 NOTE — Telephone Encounter (Signed)
Yes can be seen

## 2018-06-06 NOTE — Progress Notes (Signed)
Primary Physician/Referring:  Josetta Huddle, MD  Patient ID: Tiffany Ryan, female    DOB: [birth year], 63 y.o.   MRN: [id number]  Chief Complaint  Patient presents with  . Acute Visit  . Chest Pain    HPI: Tiffany Ryan  is a 63 y.o. female  with history of known coronary artery disease S/P 3.0 x 18 mm Cypher drug-eluting stent placed on 05/16/2003. She has a residual 70% mid to distal LAD stenosis. She also has history of hypertension, hyperlipidemia and a very strong family history of premature coronary artery disease.  Last seen in Feb for annual visit, she called our office today complaining of nitroglycerin responsive chest pain intermittently for the last 2 weeks. Chest pain occurs both at rest or with exertion and sometimes is completely resolved by nitroglycerin and sometimes not. She has associated shortness of breath. Chest pain radiates to her back and up to both shoulders. She went on a 2 mile walk today and had to take nitro afterwards. Pain did ease off, but did not completely resolve. Describes as chest heaviness today. She notices that cold weather seems to make her chest pain occur more. No PND, orthopnea, or leg swelling. I had asked her to come see [place] in office today.  When we last saw her, she had reported one episode of chest pain in Sept, but had been doing well without reoccurrence until 2 weeks ago. She has uncontrolled hyperlipidemia and has only been able to tolerate 1/3 tablet of 40 mg of Crestor without significant myalgia symptoms.  Blood pressure has been well controlled. Last labs were performed approximatley 1 year ago. She does not want to be on PCSK9 inhibitors.  She is from [place].  Past Medical History:  Diagnosis Date  . High cholesterol   . Hypertension     Past Surgical History:  Procedure Laterality Date  . ABDOMINAL AORTOGRAM N/A 03/07/2016   Procedure: Abdominal Aortogram;  Surgeon: Adrian Prows, MD;  Location: [place] [facility];  Service:  Cardiovascular;  Laterality: N/A;  . CARDIAC SURGERY     stent x 2  . COLPOSCOPY  04/2018  . CORONARY ANGIOGRAPHY N/A 03/07/2016   Procedure: Coronary Angiography and possible PCI;  Surgeon: Adrian Prows, MD;  Location: [place] [facility];  Service: Cardiovascular;  Laterality: N/A;  07:30 Case please    Social History   Socioeconomic History  . Marital status: Married    Spouse name: Not on file  . Number of children: 2  . Years of education: Not on file  . Highest education level: Not on file  Occupational History  . Not on file  Social Needs  . Financial resource strain: Not on file  . Food insecurity:    Worry: Not on file    Inability: Not on file  . Transportation needs:    Medical: Not on file    Non-medical: Not on file  Tobacco Use  . Smoking status: Former Smoker    Packs/day: 1.00    Years: 25.00    Pack years: 25.00    Types: Cigarettes    Last attempt to quit: 2006    Years since quitting: 14.3  . Smokeless tobacco: Never Used  Substance and Sexual Activity  . Alcohol use: Yes    Alcohol/week: 1.0 standard drinks    Types: 1 Glasses of wine per week    Comment: OCCASSIONAL  . Drug use: No  . Sexual activity: Not on file  Lifestyle  . Physical  activity:    Days per week: Not on file    Minutes per session: Not on file  . Stress: Not on file  Relationships  . Social connections:    Talks on phone: Not on file    Gets together: Not on file    Attends religious service: Not on file    Active member of club or organization: Not on file    Attends meetings of clubs or organizations: Not on file    Relationship status: Not on file  . Intimate partner violence:    Fear of current or ex partner: Not on file    Emotionally abused: Not on file    Physically abused: Not on file    Forced sexual activity: Not on file  Other Topics Concern  . Not on file  Social History Narrative  . Not on file    Current Outpatient Medications on File Prior to Visit   Medication Sig Dispense Refill  . aspirin 81 MG tablet Take 81 mg by mouth daily.    . carvedilol (COREG) 6.25 MG tablet Take 6.25 mg by mouth 2 (two) times daily.  1  . Cholecalciferol (VITAMIN D) 2000 units CAPS Take 2,000 Units by mouth daily.    . CRESTOR 20 MG tablet Take 20 mg by mouth daily.  3  . ibuprofen (ADVIL,MOTRIN) 200 MG tablet Take 200 mg by mouth every 6 (six) hours as needed for moderate pain.    . magnesium 30 MG tablet Take 30 mg by mouth once a week.    . nitroGLYCERIN (NITROSTAT) 0.4 MG SL tablet Place 0.4 mg under the tongue every 5 (five) minutes as needed for chest pain.    . ramipril (ALTACE) 10 MG capsule Take 10 mg by mouth daily.     No current facility-administered medications on file prior to visit.     Review of Systems  Constitution: Negative for decreased appetite, malaise/fatigue, weight gain and weight loss.  Eyes: Negative for visual disturbance.  Cardiovascular: Positive for chest pain (radiating to her back and bilateral shoulders). Negative for claudication, dyspnea on exertion, leg swelling, orthopnea, palpitations and syncope.  Respiratory: Positive for shortness of breath (associated with chest pain). Negative for hemoptysis and wheezing.   Endocrine: Negative for cold intolerance and heat intolerance.  Hematologic/Lymphatic: Does not bruise/bleed easily.  Skin: Negative for nail changes.  Musculoskeletal: Positive for myalgias. Negative for muscle weakness.  Gastrointestinal: Negative for abdominal pain, change in bowel habit, nausea and vomiting.  Neurological: Negative for difficulty with concentration, dizziness, focal weakness and headaches.  Psychiatric/Behavioral: Negative for altered mental status and suicidal ideas.  All other systems reviewed and are negative.     Objective  Blood pressure (!) 177/88, pulse 74, temperature 98.1 F (36.7 C), height _0  (1.575 m), weight 164 lb (74.4 kg), SpO2 96 %. Body mass index is 30 kg/m.     Physical Exam  Constitutional: She is oriented to person, place, and time. Vital signs are normal. She appears well-developed and well-nourished.  HENT:  Head: Normocephalic and atraumatic.  Neck: Normal range of motion.  Cardiovascular: Normal rate, regular rhythm, normal heart sounds and intact distal pulses.  Pulses:      Carotid pulses are on the right side with bruit and on the left side with bruit.      Femoral pulses are 2+ on the right side and 2+ on the left side.      Popliteal pulses are 2+ on the right side  and 2+ on the left side.       Dorsalis pedis pulses are 1+ on the right side and 1+ on the left side.       Posterior tibial pulses are 0 on the right side and 0 on the left side.  Pulmonary/Chest: Effort normal and breath sounds normal. No accessory muscle usage. No respiratory distress.  Abdominal: Soft. Bowel sounds are normal.  Musculoskeletal: Normal range of motion.  Neurological: She is alert and oriented to person, place, and time.  Skin: Skin is warm and dry.  Vitals reviewed.  Radiology: No results found.  Laboratory examination:   Labs 07/14/2017: Total cholesterol 237, triglycerides 100, HDL 51, LDL 166.  Serum glucose 121 mg, BUN 12, creatinine 0.77, eGFR 84 mL, potassium 5.2, CMP normal.  CMP Latest Ref Rng & Units 03/06/2016 03/22/2014  Glucose 65 - 99 mg/dL 129(H) 146(H)  BUN 6 - 20 mg/dL 9 9  Creatinine 0.44 - 1.00 mg/dL 0.74 0.69  Sodium 135 - 145 mmol/L 140 139  Potassium 3.5 - 5.1 mmol/L 4.4 3.6  Chloride 101 - 111 mmol/L 106 107  CO2 22 - 32 mmol/L 25 27  Calcium 8.9 - 10.3 mg/dL 9.9 9.5  Total Protein 6.0 - 8.3 g/dL - 7.3  Total Bilirubin 0.3 - 1.2 mg/dL - 0.5  Alkaline Phos 39 - 117 U/L - 52  AST 0 - 37 U/L - 18  ALT 0 - 35 U/L - 20   CBC Latest Ref Rng & Units 03/08/2016 03/07/2016 03/06/2016  WBC 4.0 - 10.5 K/uL 5.2 6.3 5.3  Hemoglobin 12.0 - 15.0 g/dL 12.0 12.7 13.5  Hematocrit 36.0 - 46.0 % 37.2 38.8 41.1  Platelets 150 - 400 K/uL 256  285 318   Lipid Panel  No results found for: CHOL, TRIG, HDL, CHOLHDL, VLDL, LDLCALC, LDLDIRECT HEMOGLOBIN A1C No results found for: HGBA1C, MPG TSH No results for input(s): TSH in the last 8760 hours.  Cardiac Studies:   Coronary angiogram 03/07/2016: Severe diffuse calcific disease in the LAD and Cx. Previous stent to right coronary artery (3.0 x 18 mm Cypher drug-eluting stent on 05/16/2003) patent with 20% ISR. Normal LVEF.  Carotid artery duplex 03/01/2016: Stenosis in the right internal carotid artery (16-49%), in the upper end of spectrum. Antegrade vertebral artery flow. Follow up in one year is appropriate if clinically indicated. No significant change from 01/03/2013.  Stress test 03/08/2015: Patient exercised on Bruce protocol for 9 minutes and 28 seconds and achieved 106% of MPHR. Occasional PVCs during stress and recovery period, underlying LBBB.Marland Kitchen Normal blood pressure response. Poststress LVEF 60%. without additional wall motion abnormality.   Assessment   Atherosclerosis of native coronary artery of native heart with unstable angina pectoris ([place]) - Plan: EKG 12-Lead  Essential hypertension  Left bundle branch block  Mixed hyperlipidemia  EKG 06/06/2018: Normal sinus rhythm at 71 bpm, normal axis, LBBB. No further analysis due to LBBB.  Recommendations:   I am concerned regarding her symptoms as patient previously had occasional symptoms of angina that was resolved with nitroglycerin; however, over the last 2 weeks has clearly had change in symptoms suggestive of unstable angina.  Nitroglycerin does alleviate her chest pain, but does not completely resolve.  Has known left bundle branch block.  Blood pressure is elevated today. She also has uncontrolled hyperlipidemia.  I have discussed with the patient that I feel she should be further evaluated with hospital admission and possible coronary angiogram.  Although patient was initially reluctant, after  further  discussion about our concerns and risk, she is agreeable to this.  We will directly admit patient today and potentially plan for coronary angiogram tomorrow unless urgently needed.    Miquel Dunn, MSN, APRN, FNP-C [facility] [place] Office: [phone] Fax: [phone]

## 2018-06-06 NOTE — Progress Notes (Signed)
ANTICOAGULATION CONSULT NOTE - Initial Consult  Pharmacy Consult for heparin Indication: ACS/unstable angina  No Known Allergies  Patient Measurements:   Heparin Dosing Weight: 66.2 kg  Vital Signs: Temp: 98.1 F (36.7 C) (05/13 1434) BP: 177/88 (05/13 1502) Pulse Rate: 74 (05/13 1502)  Labs: No results for input(s): HGB, HCT, PLT, APTT, LABPROT, INR, HEPARINUNFRC, HEPRLOWMOCWT, CREATININE, CKTOTAL, CKMB, TROPONINI in the last 72 hours.  CrCl cannot be calculated (Patient's most recent lab result is older than the maximum 21 days allowed.).   Medical History: Past Medical History:  Diagnosis Date  . High cholesterol   . Hypertension      Assessment: 63 yr old female admitted with unstable angina/ACS. Pharmacy is consulted for heparin dosing/monitoring.  Medical hx: CAD (S/P DES placement 05/16/03), residual 70% mid-distal LAD stenosis, HTN, hyperlipidemia, very strong family hx for premature CAD, 25 pack-yr smoking hx  Goal of Therapy:  Heparin level 0.3-0.7 units/ml Monitor platelets by anticoagulation protocol: Yes   Plan:  Initiate heparin with bolus of 4000 units X 1, followed by heparin infusion at 800 units/hr. Obtain 6-hr heparin level and adjust infusion as needed.  Vicki Mallet, PharmD, BCPS, [facility] Clinical Pharmacist 06/06/2018,5:21 PM

## 2018-06-06 NOTE — Telephone Encounter (Signed)
Can you please call pt back and put her on the schedule where ever this a opening this week; Thanks

## 2018-06-06 NOTE — H&P (Signed)
Tiffany Ryan is an 63 y.o. female.   Chief Complaint: chest pain HPI: Tiffany Ryan  is a 63 y.o. female  With history of known coronary artery disease S/P 3.0 x 18 mm Cypher drug-eluting stent placed on 05/16/2003. She has a residual 70% mid to distal LAD stenosis. She also has history of hypertension, hyperlipidemia and a very strong family history of premature coronary artery disease.  Patient was seen in our office today (05/13) for acute visit complaining of nitroglycerin responsive chest pain intermittently for the last 2 weeks. Chest pain occurs both at rest or with exertion and sometimes is completely resolved by nitroglycerin and sometimes not. She has associated shortness of breath. Chest pain radiates to her back and up to both shoulders. She went on a 2 mile walk today and had to take nitro afterwards. Pain did ease off, but did not completely resolve. Describes as chest heaviness today. She notices that cold weather seems to make her chest pain occur more. No PND, orthopnea, or leg swelling. As her symptoms consistent with unstable angina, hospital admission was recommended for further evaluation.  She previously has had occasional chest discomfort every few months that was resolved with nitroglycerin. She has uncontrolled hyperlipidemia and has only been able to tolerate 1/3 tablet of 40 mg of Crestor without significant myalgia symptoms.  Blood pressure has been well controlled. Last labs were performed approximatley 1 year ago.She does not want to be on PCSK9 inhibitors.  Past Medical History:  Diagnosis Date  . High cholesterol   . Hypertension     Past Surgical History:  Procedure Laterality Date  . ABDOMINAL AORTOGRAM N/A 03/07/2016   Procedure: Abdominal Aortogram;  Surgeon: Yates Decamp, MD;  Location: [facility] INVASIVE CV LAB;  Service: Cardiovascular;  Laterality: N/A;  . CARDIAC SURGERY     stent x 2  . COLPOSCOPY  04/2018  . CORONARY ANGIOGRAPHY N/A 03/07/2016   Procedure:  Coronary Angiography and possible PCI;  Surgeon: Yates Decamp, MD;  Location: [facility] INVASIVE CV LAB;  Service: Cardiovascular;  Laterality: N/A;  07:30 Case please    Family History  Problem Relation Age of Onset  . Stroke Father    Social History:  reports that she quit smoking about 14 years ago. Her smoking use included cigarettes. She has a 25.00 pack-year smoking history. She has never used smokeless tobacco. She reports current alcohol use of about 1.0 standard drinks of alcohol per week. She reports that she does not use drugs.  Allergies: No Known Allergies  Review of Systems - Negative except chest pain, shortness of breath General ROS: negative for - chills, fatigue, fever, night sweats, weight gain or weight loss Psychological ROS: negative Ophthalmic ROS: negative for - blurry vision, decreased vision or uses glasses Hematological and Lymphatic ROS: negative for - bleeding problems Endocrine ROS: negative for - polydipsia/polyuria, skin changes or temperature intolerance Respiratory ROS: positive for - shortness of breath and associated with chest pain negative for - cough, hemoptysis, orthopnea or wheezing Cardiovascular ROS: positive for - chest pain and shortness of breath negative for - edema, irregular heartbeat, loss of consciousness or paroxysmal nocturnal dyspnea Gastrointestinal ROS: no abdominal pain, change in bowel habits, or black or bloody stools Musculoskeletal ROS: negative for - gait disturbance, joint pain, joint swelling or muscular weakness Neurological ROS: no TIA or stroke symptoms Dermatological ROS: negative for nail changes  There were no vitals taken for this visit. General appearance: alert, cooperative, appears stated age and no distress Eyes:  conjunctivae/corneas clear. PERRL, EOM's intact. Fundi benign. Neck: no adenopathy, no JVD, supple, symmetrical, trachea midline, thyroid not enlarged, symmetric, no tenderness/mass/nodules and does have bilateral  carotid bruit present Neck: JVP - normal, carotids 2+= without bruits Resp: clear to auscultation bilaterally and no accessor muscle use or respiratory distress Chest wall: no tenderness Cardio: regular rate and rhythm, S1, S2 normal, no murmur, click, rub or gallop GI: soft, non-tender; bowel sounds normal; no masses,  no organomegaly Extremities: extremities normal, atraumatic, no cyanosis or edema Pulses: Left Pulses: FEM: present 2+, POP: present 2+, DP: present 1+, PT: absent Skin: Skin color, texture, turgor normal. No rashes or lesions Neurologic: Alert and oriented X 3, normal strength and tone. Normal symmetric reflexes. Normal coordination and gait  No results found for this or any previous visit (from the past 48 hour(s)). No results found.  Labs:   Lab Results  Component Value Date   WBC 5.2 03/08/2016   HGB 12.0 03/08/2016   HCT 37.2 03/08/2016   MCV 91.9 03/08/2016   PLT 256 03/08/2016   No results for input(s): NA, K, CL, CO2, BUN, CREATININE, CALCIUM, PROT, BILITOT, ALKPHOS, ALT, AST, GLUCOSE in the last 168 hours.  Invalid input(s): LABALBU  Lipid Panel  No results found for: CHOL, TRIG, HDL, CHOLHDL, VLDL, LDLCALC  BNP (last 3 results) No results for input(s): BNP in the last 8760 hours.  HEMOGLOBIN A1C No results found for: HGBA1C, MPG  Cardiac Panel (last 3 results) No results for input(s): CKTOTAL, CKMB, TROPONINI, RELINDX in the last 8760 hours.  Lab Results  Component Value Date   TROPONINI <0.03 03/07/2016     TSH No results for input(s): TSH in the last 8760 hours.  EKG: unchanged from previous tracings, normal sinus rhythm, LBBB.  Medications Prior to Admission  Medication Sig Dispense Refill  . aspirin 81 MG tablet Take 81 mg by mouth daily.    . carvedilol (COREG) 6.25 MG tablet Take 6.25 mg by mouth 2 (two) times daily.  1  . Cholecalciferol (VITAMIN D) 2000 units CAPS Take 2,000 Units by mouth daily.    . CRESTOR 20 MG tablet Take 20  mg by mouth daily.  3  . ibuprofen (ADVIL,MOTRIN) 200 MG tablet Take 200 mg by mouth every 6 (six) hours as needed for moderate pain.    . magnesium 30 MG tablet Take 30 mg by mouth once a week.    . nitroGLYCERIN (NITROSTAT) 0.4 MG SL tablet Place 0.4 mg under the tongue every 5 (five) minutes as needed for chest pain.    . ramipril (ALTACE) 10 MG capsule Take 10 mg by mouth daily.       No current facility-administered medications for this encounter.     Assessment/Plan  1. Atherosclerosis of native coronary artery of native heart with unstable angina 2. Hypertension 3. Left bundle branch block 4. Hyperlipidemia with LDL goal< 70  Plan: Patient with ongoing chest discomfort concerning for unstable angina. Will admit today for further evaluation. Has known LBBB. Hyperlipidemia is uncontrolled and hypertension elevated today as well. She will likely need coronary angiogram and possible angioplasty. We discussed regarding risks, benefits, alternatives to this including stress testing, CTA and continued medical therapy. Patient wants to proceed. Understands <1-2% risk of death, stroke, MI, urgent CABG, bleeding, infection, renal failure but not limited to these. Will make further recommendations.   Toniann FailAshton Haynes Fajr Fife, MSN, APRN, FNP-C [facility] Office: [phone] Fax: [phone]

## 2018-06-07 ENCOUNTER — Encounter (HOSPITAL_COMMUNITY)
Admission: AD | Disposition: A | Discharge status: Home / Self Care | Payer: Self-pay | Source: Ambulatory Visit | Attending: Cardiology

## 2018-06-07 ENCOUNTER — Encounter: Payer: Self-pay | Admitting: Cardiology

## 2018-06-07 ENCOUNTER — Encounter (HOSPITAL_COMMUNITY): Payer: Self-pay | Admitting: Cardiology

## 2018-06-07 ENCOUNTER — Ambulatory Visit (HOSPITAL_COMMUNITY): Admit: 2018-06-07 | Payer: BLUE CROSS/BLUE SHIELD | Admitting: Cardiology

## 2018-06-07 DIAGNOSIS — Z87891 Personal history of nicotine dependence: Secondary | ICD-10-CM | POA: Diagnosis not present

## 2018-06-07 DIAGNOSIS — R079 Chest pain, unspecified: Secondary | ICD-10-CM | POA: Diagnosis not present

## 2018-06-07 DIAGNOSIS — E7800 Pure hypercholesterolemia, unspecified: Secondary | ICD-10-CM

## 2018-06-07 DIAGNOSIS — E785 Hyperlipidemia, unspecified: Secondary | ICD-10-CM | POA: Diagnosis not present

## 2018-06-07 DIAGNOSIS — Z955 Presence of coronary angioplasty implant and graft: Secondary | ICD-10-CM | POA: Diagnosis not present

## 2018-06-07 DIAGNOSIS — I25118 Atherosclerotic heart disease of native coronary artery with other forms of angina pectoris: Secondary | ICD-10-CM | POA: Diagnosis not present

## 2018-06-07 DIAGNOSIS — Z8249 Family history of ischemic heart disease and other diseases of the circulatory system: Secondary | ICD-10-CM | POA: Diagnosis not present

## 2018-06-07 DIAGNOSIS — Z79899 Other long term (current) drug therapy: Secondary | ICD-10-CM | POA: Diagnosis not present

## 2018-06-07 DIAGNOSIS — Z1159 Encounter for screening for other viral diseases: Secondary | ICD-10-CM | POA: Diagnosis not present

## 2018-06-07 DIAGNOSIS — I10 Essential (primary) hypertension: Secondary | ICD-10-CM

## 2018-06-07 DIAGNOSIS — I447 Left bundle-branch block, unspecified: Secondary | ICD-10-CM | POA: Diagnosis not present

## 2018-06-07 DIAGNOSIS — I25110 Atherosclerotic heart disease of native coronary artery with unstable angina pectoris: Secondary | ICD-10-CM | POA: Diagnosis not present

## 2018-06-07 DIAGNOSIS — Z7982 Long term (current) use of aspirin: Secondary | ICD-10-CM | POA: Diagnosis not present

## 2018-06-07 DIAGNOSIS — I2584 Coronary atherosclerosis due to calcified coronary lesion: Secondary | ICD-10-CM | POA: Diagnosis not present

## 2018-06-07 DIAGNOSIS — R7303 Prediabetes: Secondary | ICD-10-CM | POA: Diagnosis not present

## 2018-06-07 HISTORY — PX: LEFT HEART CATH AND CORONARY ANGIOGRAPHY: CATH118249

## 2018-06-07 LAB — TROPONIN I
Troponin I: <0.03 ng/mL (ref <0.03)
Troponin I: <0.03 ng/mL (ref <0.03)

## 2018-06-07 LAB — CBC
HCT: 37.5 % (ref 36.0–46.0)
Hemoglobin: 12.3 g/dL (ref 12.0–15.0)
MCH: 30.4 pg (ref 26.0–34.0)
MCHC: 32.8 g/dL (ref 30.0–36.0)
MCV: 92.8 fL (ref 80.0–100.0)
Platelets: 299 10*3/uL (ref 150–400)
RBC: 4.04 MIL/uL (ref 3.87–5.11)
RDW: 12.6 % (ref 11.5–15.5)
WBC: 7 10*3/uL (ref 4.0–10.5)
nRBC: 0 % (ref 0.0–0.2)

## 2018-06-07 SURGERY — LEFT HEART CATH AND CORONARY ANGIOGRAPHY
Anesthesia: LOCAL

## 2018-06-07 MED ORDER — RANOLAZINE ER 1000 MG PO TB12: 1000.0000 mg | ORAL_TABLET | Freq: Two times a day (BID) | ORAL | 1 refills | Status: DC

## 2018-06-07 MED ORDER — FENTANYL CITRATE (PF) 100 MCG/2ML IJ SOLN
INTRAMUSCULAR | Status: DC | PRN
  Administered 2018-06-07: 50 ug via INTRAVENOUS

## 2018-06-07 MED ORDER — NITROGLYCERIN 1 MG/10 ML FOR IR/CATH LAB
INTRA_ARTERIAL | Status: DC | PRN
  Administered 2018-06-07: 200 ug via INTRACORONARY

## 2018-06-07 MED ORDER — LABETALOL HCL 5 MG/ML IV SOLN: 10.0000 mg | INTRAVENOUS | Status: DC | PRN

## 2018-06-07 MED ORDER — LIDOCAINE HCL (PF) 1 % IJ SOLN
INTRAMUSCULAR | Status: DC | PRN
  Administered 2018-06-07: 10 mL

## 2018-06-07 MED ORDER — HEPARIN (PORCINE) IN NACL 1000-0.9 UT/500ML-% IV SOLN
INTRAVENOUS | Status: AC
  Filled 2018-06-07: qty 500

## 2018-06-07 MED ORDER — HEPARIN (PORCINE) IN NACL 1000-0.9 UT/500ML-% IV SOLN
INTRAVENOUS | Status: DC | PRN
  Administered 2018-06-07 (×2): 500 mL

## 2018-06-07 MED ORDER — SODIUM CHLORIDE 0.9 % WEIGHT BASED INFUSION: 1.0000 mL/kg/h | INTRAVENOUS | Status: AC

## 2018-06-07 MED ORDER — SODIUM CHLORIDE 0.9 % IV SOLN: 250.0000 mL | INTRAVENOUS | Status: DC | PRN

## 2018-06-07 MED ORDER — MIDAZOLAM HCL 2 MG/2ML IJ SOLN
INTRAMUSCULAR | Status: DC | PRN
  Administered 2018-06-07: 2 mg via INTRAVENOUS

## 2018-06-07 MED ORDER — SODIUM CHLORIDE 0.9% FLUSH: 3.0000 mL | Freq: Two times a day (BID) | INTRAVENOUS | Status: DC

## 2018-06-07 MED ORDER — MIDAZOLAM HCL 2 MG/2ML IJ SOLN
INTRAMUSCULAR | Status: AC
  Filled 2018-06-07: qty 2

## 2018-06-07 MED ORDER — LIDOCAINE HCL (PF) 1 % IJ SOLN
INTRAMUSCULAR | Status: AC
  Filled 2018-06-07: qty 30

## 2018-06-07 MED ORDER — SODIUM CHLORIDE 0.9% FLUSH: 3.0000 mL | INTRAVENOUS | Status: DC | PRN

## 2018-06-07 MED ORDER — IOHEXOL 350 MG/ML SOLN
INTRAVENOUS | Status: DC | PRN
  Administered 2018-06-07: 60 mL via INTRAVENOUS

## 2018-06-07 MED ORDER — NITROGLYCERIN 1 MG/10 ML FOR IR/CATH LAB
INTRA_ARTERIAL | Status: AC
  Filled 2018-06-07: qty 10

## 2018-06-07 MED ORDER — FENTANYL CITRATE (PF) 100 MCG/2ML IJ SOLN
INTRAMUSCULAR | Status: AC
  Filled 2018-06-07: qty 2

## 2018-06-07 SURGICAL SUPPLY — 11 items
CATH INFINITI 5FR MPB2 (CATHETERS) ×1 IMPLANT
CLOSURE MYNX CONTROL 5F (Vascular Products) ×1 IMPLANT
GLIDESHEATH SLEND A-KIT 6F 22G (SHEATH) IMPLANT
KIT HEART LEFT (KITS) ×2 IMPLANT
KIT MICROPUNCTURE NIT STIFF (SHEATH) ×1 IMPLANT
PACK CARDIAC CATHETERIZATION (CUSTOM PROCEDURE TRAY) ×2 IMPLANT
SHEATH PINNACLE 5F 10CM (SHEATH) ×1 IMPLANT
SHEATH PROBE COVER 6X72 (BAG) ×1 IMPLANT
TRANSDUCER W/STOPCOCK (MISCELLANEOUS) ×2 IMPLANT
TUBING CIL FLEX 10 FLL-RA (TUBING) ×2 IMPLANT
WIRE J 3MM .035X145CM (WIRE) ×1 IMPLANT

## 2018-06-07 NOTE — Interval H&P Note (Signed)
History and Physical Interval Note:  06/07/2018 7:41 AM  Tiffany Ryan  has presented today for surgery, with the diagnosis of Unstable angina.  The various methods of treatment have been discussed with the patient and family. After consideration of risks, benefits and other options for treatment, the patient has consented to  Procedure(s): LEFT HEART CATH AND CORONARY ANGIOGRAPHY (N/A) and possible angioplasty as a surgical intervention.  The patient's history has been reviewed, patient examined, no change in status, stable for surgery.  I have reviewed the patient's chart and labs.  Questions were answered to the patient's satisfaction.    Cath Lab Visit (complete for each Cath Lab visit)  Clinical Evaluation Leading to the Procedure:   ACS: Yes.    Non-ACS:    Anginal Classification: CCS IV  Anti-ischemic medical therapy: Minimal Therapy (1 class of medications)  Non-Invasive Test Results: No non-invasive testing performed  Prior CABG: No previous CABG       Yates Decamp

## 2018-06-07 NOTE — Progress Notes (Signed)
ANTICOAGULATION CONSULT NOTE   Pharmacy Consult for heparin Indication: ACS/unstable angina  No Known Allergies  Patient Measurements:   Heparin Dosing Weight: 66.2 kg  Vital Signs: Temp: 98.4 F (36.9 C) (05/13 1700) Temp Source: Oral (05/13 1700) BP: 131/55 (05/13 2043) Pulse Rate: 72 (05/13 2043)  Labs: Recent Labs    06/06/18 1707 06/06/18 1716 06/06/18 1808 06/06/18 2325  HGB  --  13.7  --   --   HCT  --  41.4  --   --   PLT  --  359  --   --   APTT  --   --  32  --   LABPROT  --   --  13.3  --   INR  --   --  1.0  --   HEPARINUNFRC  --   --   --  0.50  CREATININE 0.69  --   --   --   TROPONINI <0.03  --   --  <0.03    Estimated Creatinine Clearance: 68.8 mL/min (by C-G formula based on SCr of 0.69 mg/dL).   Medical History: Past Medical History:  Diagnosis Date  . High cholesterol   . Hypertension      Assessment: 63 yr old female admitted with unstable angina/ACS. Pharmacy is consulted for heparin dosing/monitoring. Initial heparin level therapeutic 0.5 unit/ml  Goal of Therapy:  Heparin level 0.3-0.7 units/ml Monitor platelets by anticoagulation protocol: Yes   Plan:  Continue heparin at 800 units/hr  Thanks for allowing pharmacy to be a part of this patient's care.  Talbert Cage, PharmD Clinical Pharmacist 06/07/2018,12:25 AM

## 2018-06-07 NOTE — Discharge Summary (Signed)
Physician Discharge Summary  Patient ID: Tiffany Ryan Leanor KailMRN: [id number] DOB/AGE: [birth year] 63 y.o.  Admit date: 06/06/2018 Discharge date: 06/07/2018  Primary Discharge Diagnosis 1.  Unstable angina pectoris 2.  Coronary artery disease of the native vessel with other forms of angina with history of right coronary stents. 3.  Hyperlipidemia 4.  Prediabetes mellitus  Significant Diagnostic Studies:  EKG 06/06/2018: Normal sinus rhythm, left bundle branch block.  No further analysis.  No change from prior EKG.  Coronary angiogram 06/07/2018: Right dominant circulation, severely diffusely calcified vessels, previously placed stents in the proximal and mid RCA are widely patent with very mild neointimal hyperplasia in the proximal segment.  LAD is diffusely diseased and there is a focal 70 to 75% stenosis which is probably significant by FFR, however the LAD is diffusely diseased.  Lesion does not suggest unstable lesion and is heavily calcified.  Ramus and circumflex are moderately diffusely diseased and calcified without luminal high-grade stenosis.  Normal LV systolic function, normal LVEDP.  Hospital Course: Admitted with chest pain suggestive of unstable angina, has underlying left bundle branch block and EKG uninterpretable.  She was ruled out for myocardial infarction.  Underwent coronary angiography and was found to have progression of disease in the LAD and a focal stenosis in the mid LAD at 75% however felt diffuse disease and medical management recommended.   Recommendations on discharge: Recommendation: Patient symptoms can be treated medically, she needs to have lipids controlled, Ranexa will be started.  In spite of aggressive control of lipids and Ranexa, if she continues to have angina pectoris, will consider atherectomy of the mid LAD followed by stenting.  60 mL contrast utilized.  She will be discharged home today.  I have discussed with her regarding starting PCSK9 inhibitors as she  has not been able to tolerate Zetia in the past and high-dose Crestor caused her to have myalgias.  Also I do not expect 40 mg of Crestor to bring the LDL down to goal of less than 70.  She would like to further discuss this in the outpatient basis.  Discharge Exam: Blood pressure 131/69, pulse 67, temperature 98.2 F (36.8 C), temperature source Oral, resp. rate 20, weight 74.6 kg, SpO2 100 %.  Physical Exam  Constitutional: She is oriented to person, place, and time. Vital signs are normal. She appears well-developed and well-nourished.  HENT:  Head: Normocephalic and atraumatic.  Neck: Normal range of motion.  Cardiovascular: Normal rate, regular rhythm, normal heart sounds and intact distal pulses.  Pulses:      Carotid pulses are on the right side with bruit and on the left side with bruit.      Femoral pulses are 2+ on the right side and 2+ on the left side.      Popliteal pulses are 2+ on the right side and 2+ on the left side.       Dorsalis pedis pulses are 1+ on the right side and 1+ on the left side.       Posterior tibial pulses are 0 on the right side and 0 on the left side.  Pulmonary/Chest: Effort normal and breath sounds normal. No accessory muscle usage. No respiratory distress.  Abdominal: Soft. Bowel sounds are normal.  Musculoskeletal: Normal range of motion.  Neurological: She is alert and oriented to person, place, and time.  Skin: Skin is warm and dry.  Vitals reviewed. Labs:   Lab Results  Component Value Date   WBC 7.0 06/07/2018   HGB  12.3 06/07/2018   HCT 37.5 06/07/2018   MCV 92.8 06/07/2018   PLT 299 06/07/2018    Recent Labs  Lab 06/06/18 1707  NA 141  K 4.4  CL 105  CO2 25  BUN 10  CREATININE 0.69  CALCIUM 9.6  GLUCOSE 135*    Lipid Panel     Component Value Date/Time   CHOL 228 (H) 06/06/2018 1707   TRIG 156 (H) 06/06/2018 1707   HDL 43 06/06/2018 1707   CHOLHDL 5.3 06/06/2018 1707   VLDL 31 06/06/2018 1707   LDLCALC 154 (H)  06/06/2018 1707    BNP (last 3 results) No results for input(s): BNP in the last 8760 hours.  HEMOGLOBIN A1C Lab Results  Component Value Date   HGBA1C 6.2 (H) 06/06/2018   MPG 131.24 06/06/2018    Cardiac Panel (last 3 results) Recent Labs    06/06/18 1707 06/06/18 2325 06/07/18 0509  TROPONINI <0.03 <0.03 <0.03    Lab Results  Component Value Date   TROPONINI <0.03 06/07/2018     TSH Recent Labs    06/06/18 1707  TSH 2.064   FOLLOW UP PLANS AND APPOINTMENTS  Allergies as of 06/07/2018   No Known Allergies     Medication List    TAKE these medications   aspirin 81 MG tablet Take 81 mg by mouth daily.   carvedilol 6.25 MG tablet Commonly known as:  COREG Take 6.25 mg by mouth 2 (two) times daily.   Crestor 20 MG tablet Generic drug:  rosuvastatin Take 20 mg by mouth daily.   ibuprofen 200 MG tablet Commonly known as:  ADVIL Take 200 mg by mouth every 6 (six) hours as needed for moderate pain.   magnesium 30 MG tablet Take 30 mg by mouth once a week.   nitroGLYCERIN 0.4 MG SL tablet Commonly known as:  NITROSTAT Place 0.4 mg under the tongue every 5 (five) minutes as needed for chest pain.   ramipril 10 MG capsule Commonly known as:  ALTACE Take 10 mg by mouth daily.   ranolazine 1000 MG SR tablet Commonly known as:  Ranexa Take 1 tablet (1,000 mg total) by mouth 2 (two) times daily.   Vitamin D 50 MCG (2000 UT) Caps Take 2,000 Units by mouth daily.      Follow-up Information    Yates Decamp, MD In 2 weeks.   Specialty:  Cardiology Contact information: [address] [place] [address] [phone]            Yates Decamp, MD 06/07/2018, 8:38 AM  Pager: [phone] Office: [phone] If no answer: [phone]

## 2018-06-08 ENCOUNTER — Ambulatory Visit: Payer: BLUE CROSS/BLUE SHIELD | Admitting: Cardiology

## 2018-06-13 DIAGNOSIS — R1310 Dysphagia, unspecified: Secondary | ICD-10-CM | POA: Diagnosis not present

## 2018-06-13 DIAGNOSIS — R079 Chest pain, unspecified: Secondary | ICD-10-CM | POA: Diagnosis not present

## 2018-06-13 DIAGNOSIS — K210 Gastro-esophageal reflux disease with esophagitis: Secondary | ICD-10-CM | POA: Diagnosis not present

## 2018-06-13 DIAGNOSIS — R87619 Unspecified abnormal cytological findings in specimens from cervix uteri: Secondary | ICD-10-CM | POA: Diagnosis not present

## 2018-06-21 ENCOUNTER — Encounter: Payer: Self-pay | Admitting: Cardiology

## 2018-06-21 ENCOUNTER — Other Ambulatory Visit: Payer: Self-pay

## 2018-06-21 ENCOUNTER — Ambulatory Visit (INDEPENDENT_AMBULATORY_CARE_PROVIDER_SITE_OTHER): Payer: BC Managed Care – PPO | Admitting: Cardiology

## 2018-06-21 VITALS — BP 135/63 | HR 69 | Ht 62.0 in | Wt 160.8 lb

## 2018-06-21 DIAGNOSIS — I6521 Occlusion and stenosis of right carotid artery: Secondary | ICD-10-CM | POA: Diagnosis not present

## 2018-06-21 DIAGNOSIS — I25118 Atherosclerotic heart disease of native coronary artery with other forms of angina pectoris: Secondary | ICD-10-CM

## 2018-06-21 DIAGNOSIS — I10 Essential (primary) hypertension: Secondary | ICD-10-CM | POA: Insufficient documentation

## 2018-06-21 DIAGNOSIS — E782 Mixed hyperlipidemia: Secondary | ICD-10-CM

## 2018-06-21 MED ORDER — ALIROCUMAB 75 MG/ML ~~LOC~~ SOAJ: 1.0000 "pen " | SUBCUTANEOUS | 4 refills | Status: DC

## 2018-06-21 NOTE — Progress Notes (Signed)
Primary Physician/Referring:  Josetta Huddle, MD  Patient ID: Tiffany Ryan, female    DOB: [birth year], 63 y.o.   MRN: [id number]  Chief Complaint  Patient presents with   Hypertension   Follow-up    L heart cath    HPI: Tiffany Ryan  is a 63 y.o. female  with Hypertension, hyperglycemia,family history of premature coronary artery disease, Hyperlipidemia, CAD S/P 3.0 x 18 mm Cypher drug-eluting stent to mid RCA placed on 05/16/2003. Due to chest pain with radiation to her neck artery frequently with frequent use of sublingual nitroglycerin, symptoms suggestive of unstable angina, was admitted to the hospital and underwent coronary angiography on 06/07/2018, Severe heavy calcification was evident in all 3 coronary vessels with patent RCA stent and has a 70% To 75% mid to distal LAD stenosis, Probably physiologically significant but was recommended medical therapy in view of diffuse calcification and uncontrolled hyperlipidemia.  She now presents to the office for follow-up, since starting Ranexa has noticed marked improvement in dyspnea and also exertional chest pain and has started resuming her activity.  She is very concerned about CAD and wonders whether atherectomy and stenting would be an option.  She is also worried about carotid artery disease.   She has uncontrolled hyperlipidemia and has only been able to tolerate 20 mg of Crestor without significant myalgia symptoms. She did not tolerate Zetia in past with abdominal discomfort.   Past Medical History:  Diagnosis Date   High cholesterol    Hypertension     Past Surgical History:  Procedure Laterality Date   ABDOMINAL AORTOGRAM N/A 03/07/2016   Procedure: Abdominal Aortogram;  Surgeon: Adrian Prows, MD;  Location: [facility];  Service: Cardiovascular;  Laterality: N/A;   CARDIAC SURGERY     stent x 2   COLPOSCOPY  04/2018   CORONARY ANGIOGRAPHY N/A 03/07/2016   Procedure: Coronary Angiography and possible PCI;  Surgeon: Adrian Prows, MD;  Location: [facility];  Service: Cardiovascular;  Laterality: N/A;  07:30 Case please   LEFT HEART CATH AND CORONARY ANGIOGRAPHY N/A 06/07/2018   Procedure: LEFT HEART CATH AND CORONARY ANGIOGRAPHY;  Surgeon: Adrian Prows, MD;  Location: [facility];  Service: Cardiovascular;  Laterality: N/A;    Social History   Socioeconomic History   Marital status: Married    Spouse name: Not on file   Number of children: 2   Years of education: Not on file   Highest education level: Not on file  Occupational History   Not on file  Social Needs   Financial resource strain: Not on file   Food insecurity:    Worry: Not on file    Inability: Not on file   Transportation needs:    Medical: Not on file    Non-medical: Not on file  Tobacco Use   Smoking status: Former Smoker    Packs/day: 1.00    Years: 25.00    Pack years: 25.00    Types: Cigarettes    Last attempt to quit: 2006    Years since quitting: 14.4   Smokeless tobacco: Never Used  Substance and Sexual Activity   Alcohol use: Yes    Alcohol/week: 1.0 standard drinks    Types: 1 Glasses of wine per week    Comment: OCCASSIONAL   Drug use: No   Sexual activity: Not on file  Lifestyle   Physical activity:    Days per week: Not on file    Minutes per session: Not  on file   Stress: Not on file  Relationships   Social connections:    Talks on phone: Not on file    Gets together: Not on file    Attends religious service: Not on file    Active member of club or organization: Not on file    Attends meetings of clubs or organizations: Not on file    Relationship status: Not on file   Intimate partner violence:    Fear of current or ex partner: Not on file    Emotionally abused: Not on file    Physically abused: Not on file    Forced sexual activity: Not on file  Other Topics Concern   Not on file  Social History Narrative   Not on file    Current Outpatient Medications on File  Prior to Visit  Medication Sig Dispense Refill   aspirin 81 MG tablet Take 81 mg by mouth daily.     carvedilol (COREG) 6.25 MG tablet Take 6.25 mg by mouth 2 (two) times daily.  1   Cholecalciferol (VITAMIN D) 2000 units CAPS Take 2,000 Units by mouth daily.     CRESTOR 20 MG tablet Take 20 mg by mouth daily.  3   ibuprofen (ADVIL,MOTRIN) 200 MG tablet Take 200 mg by mouth every 6 (six) hours as needed for moderate pain.     magnesium 30 MG tablet Take 30 mg by mouth once a week.     nitroGLYCERIN (NITROSTAT) 0.4 MG SL tablet Place 0.4 mg under the tongue every 5 (five) minutes as needed for chest pain.     ramipril (ALTACE) 10 MG capsule Take 10 mg by mouth daily.     ranolazine (RANEXA) 1000 MG SR tablet Take 1 tablet (1,000 mg total) by mouth 2 (two) times daily. (Patient taking differently: Take 500 mg by mouth 2 (two) times daily. ) 60 tablet 1   No current facility-administered medications on file prior to visit.    Review of Systems  Constitution: Negative for decreased appetite, malaise/fatigue, weight gain and weight loss.  Eyes: Negative for visual disturbance.  Cardiovascular: Positive for chest pain (radiating to her back and bilateral shoulders). Negative for claudication, dyspnea on exertion, leg swelling, orthopnea, palpitations and syncope.  Respiratory: Positive for shortness of breath (associated with chest pain). Negative for hemoptysis and wheezing.   Endocrine: Negative for cold intolerance and heat intolerance.  Hematologic/Lymphatic: Does not bruise/bleed easily.  Skin: Negative for nail changes.  Musculoskeletal: Positive for myalgias. Negative for muscle weakness.  Gastrointestinal: Negative for abdominal pain, change in bowel habit, nausea and vomiting.  Neurological: Negative for difficulty with concentration, dizziness, focal weakness and headaches.  Psychiatric/Behavioral: Negative for altered mental status and suicidal ideas.  All other systems  reviewed and are negative.     Objective  Blood pressure 135/63, pulse 69, height '5\' 2"'$  (1.575 m), weight 160 lb 12.8 oz (72.9 kg), SpO2 96 %. Body mass index is 29.41 kg/m.    Physical Exam  Constitutional: She is oriented to person, place, and time. Vital signs are normal. She appears well-developed and well-nourished.  HENT:  Head: Normocephalic and atraumatic.  Neck: Normal range of motion.  Cardiovascular: Normal rate, regular rhythm, normal heart sounds and intact distal pulses.  Pulses:      Carotid pulses are on the right side with bruit and on the left side with bruit.      Femoral pulses are 2+ on the right side and 2+ on the left  side.      Popliteal pulses are 2+ on the right side and 2+ on the left side.       Dorsalis pedis pulses are 1+ on the right side and 1+ on the left side.       Posterior tibial pulses are 0 on the right side and 0 on the left side.  Pulmonary/Chest: Effort normal and breath sounds normal. No accessory muscle usage. No respiratory distress.  Abdominal: Soft. Bowel sounds are normal.  Musculoskeletal: Normal range of motion.  Neurological: She is alert and oriented to person, place, and time.  Skin: Skin is warm and dry.  Vitals reviewed.  Radiology: No results found.  Laboratory examination:   Labs 07/14/2017: Total cholesterol 237, triglycerides 100, HDL 51, LDL 166.  Serum glucose 121 mg, BUN 12, creatinine 0.77, eGFR 84 mL, potassium 5.2, CMP normal.  CMP Latest Ref Rng & Units 06/06/2018 03/06/2016 03/22/2014  Glucose 70 - 99 mg/dL 135(H) 129(H) 146(H)  BUN 8 - 23 mg/dL '10 9 9  '$ Creatinine 0.44 - 1.00 mg/dL 0.69 0.74 0.69  Sodium 135 - 145 mmol/L 141 140 139  Potassium 3.5 - 5.1 mmol/L 4.4 4.4 3.6  Chloride 98 - 111 mmol/L 105 106 107  CO2 22 - 32 mmol/L '25 25 27  '$ Calcium 8.9 - 10.3 mg/dL 9.6 9.9 9.5  Total Protein 6.0 - 8.3 g/dL - - 7.3  Total Bilirubin 0.3 - 1.2 mg/dL - - 0.5  Alkaline Phos 39 - 117 U/L - - 52  AST 0 - 37 U/L - - 18    ALT 0 - 35 U/L - - 20   CBC Latest Ref Rng & Units 06/07/2018 06/06/2018 03/08/2016  WBC 4.0 - 10.5 K/uL 7.0 6.9 5.2  Hemoglobin 12.0 - 15.0 g/dL 12.3 13.7 12.0  Hematocrit 36.0 - 46.0 % 37.5 41.4 37.2  Platelets 150 - 400 K/uL 299 359 256   Lipid Panel     Component Value Date/Time   CHOL 228 (H) 06/06/2018 1707   TRIG 156 (H) 06/06/2018 1707   HDL 43 06/06/2018 1707   CHOLHDL 5.3 06/06/2018 1707   VLDL 31 06/06/2018 1707   LDLCALC 154 (H) 06/06/2018 1707   HEMOGLOBIN A1C Lab Results  Component Value Date   HGBA1C 6.2 (H) 06/06/2018   MPG 131.24 06/06/2018   TSH Recent Labs    06/06/18 1707  TSH 2.064    Cardiac Studies:   Coronary angiogram 06/07/2018: Right dominant circulation, severely diffusely calcified vessels, previously placed stents in the proximal and mid RCA are widely patent with very mild neointimal hyperplasia in the proximal segment.  LAD is diffusely diseased and there is a focal 70 to 75% stenosis which is probably significant by FFR, however the LAD is diffusely diseased.  Lesion does not suggest unstable lesion and is heavily calcified.  Ramus and circumflex are moderately diffusely diseased and calcified without luminal high-grade stenosis.  Normal LV systolic function, normal LVEDP.  (right coronary artery (3.0 x 18 mm Cypher drug-eluting stent on 05/16/2003 patent).  Carotid artery duplex 03/01/2016: Stenosis in the right internal carotid artery (16-49%), in the upper end of spectrum. Antegrade vertebral artery flow. Follow up in one year is appropriate if clinically indicated. No significant change from 01/03/2013.  Stress test 03/08/2015: Patient exercised on Bruce protocol for 9 minutes and 28 seconds and achieved 106% of MPHR. Occasional PVCs during stress and recovery period, underlying LBBB.Marland Kitchen Normal blood pressure response. Poststress LVEF 60%. without additional wall motion  abnormality.   Assessment   Atherosclerosis of native coronary  artery of native heart with stable angina pectoris ([place]) - Plan: Alirocumab (PRALUENT) 75 MG/ML SOAJ  Mixed hyperlipidemia - Plan: Alirocumab (PRALUENT) 75 MG/ML SOAJ  Essential hypertension  Asymptomatic stenosis of right carotid artery  EKG 06/06/2018: Normal sinus rhythm at 71 bpm, normal axis, LBBB. No further analysis due to LBBB.  Recommendations:   Patient has severe calcific coronary artery disease involving all 3 vessels with of the mid RCA stent placed in 2005.  Mid LAD stenosis is probably hemodynamically significant but these was deferred due to severe diffuse calcification and need for aggressive medical therapy.  Ranexa has made significant improvement in symptoms of angina.  Continue the same.  With regard to hyperlipidemia, I had multiple discussions in the past regarding PCSK 9 inhibitors in view of the patient's inability to tolerate statins and there is been clearly progression of diffuse nature of the same I am also concerned about progression of carotid disease as well.  After long discussion, she is willing to try PCSK 9 inhibitors, we'll start her on Praluent 75 mg sq every 2 weeks.    With regard to carotid artery disease, she's been scheduled for carotid duplex and will follow-up on the same.  I'd like to see her back in 4-6 weeks to follow-up, she will need repeat lipid profile testing prior to her visit.  This was a 40 minute office visit encounter with greater than 50% of the time spent face-to-face discussions regarding severe diffuse coronary disease, medical therapy versus intervention and management of multiple cardiovascular risk factors.  Adrian Prows, MD, [facility] 06/21/2018, 9:31 PM [facility]. [place] Pager: [phone] Office: [phone] If no answer Cell [phone]

## 2018-06-22 ENCOUNTER — Ambulatory Visit (INDEPENDENT_AMBULATORY_CARE_PROVIDER_SITE_OTHER): Payer: BC Managed Care – PPO | Admitting: Cardiology

## 2018-06-22 DIAGNOSIS — E782 Mixed hyperlipidemia: Secondary | ICD-10-CM | POA: Diagnosis not present

## 2018-06-22 DIAGNOSIS — Z5181 Encounter for therapeutic drug level monitoring: Secondary | ICD-10-CM

## 2018-06-22 DIAGNOSIS — Z7901 Long term (current) use of anticoagulants: Secondary | ICD-10-CM

## 2018-06-22 MED ORDER — ALIROCUMAB 75 MG/ML ~~LOC~~ SOSY: 75.0000 mg | PREFILLED_SYRINGE | Freq: Once | SUBCUTANEOUS | Status: DC

## 2018-06-22 NOTE — Progress Notes (Signed)
Patient received subcu Praluent 75 mg today on 06/22/2018.

## 2018-07-06 ENCOUNTER — Ambulatory Visit (INDEPENDENT_AMBULATORY_CARE_PROVIDER_SITE_OTHER): Payer: BC Managed Care – PPO | Admitting: Cardiology

## 2018-07-06 ENCOUNTER — Other Ambulatory Visit: Payer: Self-pay

## 2018-07-06 DIAGNOSIS — E782 Mixed hyperlipidemia: Secondary | ICD-10-CM | POA: Diagnosis not present

## 2018-07-06 MED ORDER — ALIROCUMAB 75 MG/ML ~~LOC~~ SOSY
75.0000 mg | PREFILLED_SYRINGE | Freq: Once | SUBCUTANEOUS | Status: AC
  Administered 2018-07-06: 75 mg via SUBCUTANEOUS

## 2018-07-19 ENCOUNTER — Ambulatory Visit: Payer: BC Managed Care – PPO

## 2018-07-19 ENCOUNTER — Other Ambulatory Visit: Payer: Self-pay

## 2018-07-19 ENCOUNTER — Ambulatory Visit (INDEPENDENT_AMBULATORY_CARE_PROVIDER_SITE_OTHER): Payer: BC Managed Care – PPO

## 2018-07-19 DIAGNOSIS — I6521 Occlusion and stenosis of right carotid artery: Secondary | ICD-10-CM | POA: Diagnosis not present

## 2018-07-23 ENCOUNTER — Telehealth: Payer: Self-pay | Admitting: Cardiology

## 2018-07-23 NOTE — Telephone Encounter (Signed)
Discussed carotid results with the patient, will repeat in 6 months for continued surveillance. Advised continued aggressive control of cholesterol.   Patient asked me about her Praluent. Made her aware that Idiscussed with the rep today and that we have faxed off the request for samples and will hopefully have them in the next few days. I advised her that I was unsure on where we were with her prescription, but that someone from our office would be in contact with her.

## 2018-07-26 NOTE — Telephone Encounter (Signed)
Will contact [facility] to complete PA [phone]

## 2018-07-30 ENCOUNTER — Telehealth: Payer: Self-pay

## 2018-08-02 ENCOUNTER — Ambulatory Visit: Payer: BLUE CROSS/BLUE SHIELD | Admitting: Cardiology

## 2018-08-02 ENCOUNTER — Telehealth: Payer: Self-pay

## 2018-08-02 NOTE — Telephone Encounter (Signed)
LVM for patient to call office in regards to praluent injection.

## 2018-08-03 ENCOUNTER — Ambulatory Visit (INDEPENDENT_AMBULATORY_CARE_PROVIDER_SITE_OTHER): Payer: BC Managed Care – PPO | Admitting: Cardiology

## 2018-08-03 ENCOUNTER — Other Ambulatory Visit: Payer: Self-pay

## 2018-08-03 DIAGNOSIS — E7800 Pure hypercholesterolemia, unspecified: Secondary | ICD-10-CM

## 2018-08-03 MED ORDER — ALIROCUMAB 75 MG/ML ~~LOC~~ SOAJ: 75.0000 mg | Freq: Once | SUBCUTANEOUS | Status: DC

## 2018-08-04 NOTE — Progress Notes (Signed)
Alirocumab SOAJ 75 mg  [[id number]]  Ordered Dose: 75 mg Route: Subcutaneous Frequency: Once  Administration Dose: --     Scheduled Start Date/Time: 08/03/18 0845 End Date/Time: No end date specified (ordered for 1 doses)      Admin Instructions:  Administered 75 mg/ml into R Thigh

## 2018-08-06 ENCOUNTER — Other Ambulatory Visit: Payer: Self-pay

## 2018-08-06 ENCOUNTER — Ambulatory Visit: Payer: BC Managed Care – PPO | Admitting: Cardiology

## 2018-08-06 ENCOUNTER — Encounter: Payer: Self-pay | Admitting: Cardiology

## 2018-08-06 VITALS — BP 151/96 | HR 76 | Ht 63.0 in | Wt 150.0 lb

## 2018-08-06 DIAGNOSIS — I25118 Atherosclerotic heart disease of native coronary artery with other forms of angina pectoris: Secondary | ICD-10-CM

## 2018-08-06 DIAGNOSIS — I10 Essential (primary) hypertension: Secondary | ICD-10-CM

## 2018-08-06 DIAGNOSIS — E7800 Pure hypercholesterolemia, unspecified: Secondary | ICD-10-CM | POA: Insufficient documentation

## 2018-08-06 DIAGNOSIS — E785 Hyperlipidemia, unspecified: Secondary | ICD-10-CM

## 2018-08-06 HISTORY — DX: Hyperlipidemia, unspecified: E78.5

## 2018-08-06 MED ORDER — AMLODIPINE BESYLATE 5 MG PO TABS: 5.0000 mg | ORAL_TABLET | Freq: Every day | ORAL | 2 refills | Status: DC

## 2018-08-06 NOTE — Progress Notes (Signed)
Virtual Visit via Video Note: This visit type was conducted due to national recommendations for restrictions regarding the COVID-19 Pandemic (e.g. social distancing).  This format is felt to be most appropriate for this patient at this time.  All issues noted in this document were discussed and addressed.  No physical exam was performed (except for noted visual exam findings with Telehealth visits).  The patient has consented to conduct a Telehealth visit and understands insurance will be billed.   I connected with@, on 08/06/18 at  by a video enabled telemedicine application and verified that I am speaking with the correct person using two identifiers.   I discussed the limitations of evaluation and management by telemedicine and the availability of in person appointments. The patient expressed understanding and agreed to proceed.   I have discussed with patient regarding the safety during COVID Pandemic and steps and precautions to be taken including social distancing, frequent hand wash and use of detergent soap, gels with the patient. I asked the patient to avoid touching mouth, nose, eyes, ears with the hands. I encouraged regular walking around the neighborhood and exercise and regular diet, as long as social distancing can be maintained.   Primary Physician/Referring:  Josetta Huddle, MD  Patient ID: Tiffany Ryan, female    DOB: [birth year], 63 y.o.   MRN: [id number]  Chief Complaint  Patient presents with  . Hypertension  . Follow-up    HPI: Tiffany Ryan  is a 63 y.o. female  with Hypertension, hyperglycemia,family history of premature coronary artery disease, Hyperlipidemia, CAD S/P 3.0 x 18 mm Cypher drug-eluting stent to mid RCA placed on 05/16/2003. Due to chest pain with radiation to her neck artery frequently with frequent use of sublingual nitroglycerin, symptoms suggestive of unstable angina, was admitted to the hospital and underwent coronary angiography on 06/07/2018, Severe heavy  calcification was evident in all 3 coronary vessels with patent RCA stent and has a 70% To 75% mid to distal LAD stenosis, Probably physiologically significant but was recommended medical therapy in view of diffuse calcification and uncontrolled hyperlipidemia.  She has uncontrolled hyperlipidemia and has only been able to tolerate 20 mg of Crestor without significant myalgia symptoms. She did not tolerate Zetia in past with abdominal discomfort.  She is presently on Praluent 75 mg subcu every 2 weeks.  She is tolerating this without any complications but states that she would like to come off of Crestor as she has started to notice myalgias again even with 20 mg.  Since being on Ranexa, has noticed marked improvement in anginal symptoms.  Has not had to use sublingual nitroglycerin in the past 6 weeks.  Past Medical History:  Diagnosis Date  . High cholesterol   . Hyperlipemia 08/06/2018  . Hypertension     Past Surgical History:  Procedure Laterality Date  . ABDOMINAL AORTOGRAM N/A 03/07/2016   Procedure: Abdominal Aortogram;  Surgeon: Adrian Prows, MD;  Location: [facility];  Service: Cardiovascular;  Laterality: N/A;  . CARDIAC SURGERY     stent x 2  . COLPOSCOPY  04/2018  . CORONARY ANGIOGRAPHY N/A 03/07/2016   Procedure: Coronary Angiography and possible PCI;  Surgeon: Adrian Prows, MD;  Location: [facility];  Service: Cardiovascular;  Laterality: N/A;  07:30 Case please  . LEFT HEART CATH AND CORONARY ANGIOGRAPHY N/A 06/07/2018   Procedure: LEFT HEART CATH AND CORONARY ANGIOGRAPHY;  Surgeon: Adrian Prows, MD;  Location: [facility];  Service: Cardiovascular;  Laterality: N/A;  Social History   Socioeconomic History  . Marital status: Married    Spouse name: Not on file  . Number of children: 2  . Years of education: Not on file  . Highest education level: Not on file  Occupational History  . Not on file  Social Needs  . Financial resource strain: Not on file   . Food insecurity    Worry: Not on file    Inability: Not on file  . Transportation needs    Medical: Not on file    Non-medical: Not on file  Tobacco Use  . Smoking status: Former Smoker    Packs/day: 1.00    Years: 25.00    Pack years: 25.00    Types: Cigarettes    Quit date: 2006    Years since quitting: 14.5  . Smokeless tobacco: Never Used  Substance and Sexual Activity  . Alcohol use: Yes    Alcohol/week: 1.0 standard drinks    Types: 1 Glasses of wine per week    Comment: OCCASSIONAL  . Drug use: No  . Sexual activity: Not on file  Lifestyle  . Physical activity    Days per week: Not on file    Minutes per session: Not on file  . Stress: Not on file  Relationships  . Social Herbalist on phone: Not on file    Gets together: Not on file    Attends religious service: Not on file    Active member of club or organization: Not on file    Attends meetings of clubs or organizations: Not on file    Relationship status: Not on file  . Intimate partner violence    Fear of current or ex partner: Not on file    Emotionally abused: Not on file    Physically abused: Not on file    Forced sexual activity: Not on file  Other Topics Concern  . Not on file  Social History Narrative  . Not on file    Current Outpatient Medications on File Prior to Visit  Medication Sig Dispense Refill  . Alirocumab (PRALUENT) 75 MG/ML SOAJ Inject 1 pen into the skin every 14 (fourteen) days. 6 pen 4  . aspirin 81 MG tablet Take 81 mg by mouth daily.    . carvedilol (COREG) 6.25 MG tablet Take 6.25 mg by mouth 2 (two) times daily.  1  . Cholecalciferol (VITAMIN D) 2000 units CAPS Take 2,000 Units by mouth daily.    . CRESTOR 20 MG tablet Take 20 mg by mouth daily.  3  . ibuprofen (ADVIL,MOTRIN) 200 MG tablet Take 200 mg by mouth every 6 (six) hours as needed for moderate pain.    . magnesium 30 MG tablet Take 30 mg by mouth once a week.    . nitroGLYCERIN (NITROSTAT) 0.4 MG SL  tablet Place 0.4 mg under the tongue every 5 (five) minutes as needed for chest pain.    . ramipril (ALTACE) 10 MG capsule Take 10 mg by mouth daily.    . ranolazine (RANEXA) 1000 MG SR tablet Take 1 tablet (1,000 mg total) by mouth 2 (two) times daily. (Patient taking differently: Take 500 mg by mouth 2 (two) times daily. ) 60 tablet 1   Current Facility-Administered Medications on File Prior to Visit  Medication Dose Route Frequency Provider Last Rate Last Dose  . Alirocumab SOAJ 75 mg  75 mg Subcutaneous Once Miquel Dunn, NP      . Alirocumab  SOSY 75 mg  75 mg Subcutaneous Once Adrian Prows, MD       Review of Systems  Constitution: Negative for decreased appetite, malaise/fatigue, weight gain and weight loss.  Eyes: Negative for visual disturbance.  Cardiovascular: Positive for chest pain (radiating to her back and bilateral shoulders). Negative for claudication, dyspnea on exertion, leg swelling, orthopnea, palpitations and syncope.  Respiratory: Positive for shortness of breath (associated with chest pain). Negative for hemoptysis and wheezing.   Endocrine: Negative for cold intolerance and heat intolerance.  Hematologic/Lymphatic: Does not bruise/bleed easily.  Skin: Negative for nail changes.  Musculoskeletal: Positive for myalgias. Negative for muscle weakness.  Gastrointestinal: Negative for abdominal pain, change in bowel habit, nausea and vomiting.  Neurological: Negative for difficulty with concentration, dizziness, focal weakness and headaches.  Psychiatric/Behavioral: Negative for altered mental status and suicidal ideas.  All other systems reviewed and are negative.     Objective  Blood pressure (!) 151/96, pulse 76, height _0  (1.6 m), weight 150 lb (68 kg). Body mass index is 26.57 kg/m.   Physical exam not performed or limited due to virtual visit.  Patient appeared to be in no distress, Neck was supple, respiration was not labored.  Please see exam details  from prior visit is as below.  Physical Exam  Constitutional: She is oriented to person, place, and time. Vital signs are normal. She appears well-developed and well-nourished.  HENT:  Head: Normocephalic and atraumatic.  Neck: Normal range of motion.  Cardiovascular: Normal rate, regular rhythm, normal heart sounds and intact distal pulses.  Pulses:      Carotid pulses are on the right side with bruit and on the left side with bruit.      Femoral pulses are 2+ on the right side and 2+ on the left side.      Popliteal pulses are 2+ on the right side and 2+ on the left side.       Dorsalis pedis pulses are 1+ on the right side and 1+ on the left side.       Posterior tibial pulses are 0 on the right side and 0 on the left side.  Pulmonary/Chest: Effort normal and breath sounds normal. No accessory muscle usage. No respiratory distress.  Abdominal: Soft. Bowel sounds are normal.  Musculoskeletal: Normal range of motion.  Neurological: She is alert and oriented to person, place, and time.  Skin: Skin is warm and dry.  Vitals reviewed.  Radiology: No results found.  Laboratory examination:   Labs 07/14/2017: Total cholesterol 237, triglycerides 100, HDL 51, LDL 166.  Serum glucose 121 mg, BUN 12, creatinine 0.77, eGFR 84 mL, potassium 5.2, CMP normal.  CMP Latest Ref Rng & Units 06/06/2018 03/06/2016 03/22/2014  Glucose 70 - 99 mg/dL 135(H) 129(H) 146(H)  BUN 8 - 23 mg/dL _1 Creatinine 0.44 - 1.00 mg/dL 0.69 0.74 0.69  Sodium 135 - 145 mmol/L 141 140 139  Potassium 3.5 - 5.1 mmol/L 4.4 4.4 3.6  Chloride 98 - 111 mmol/L 105 106 107  CO2 22 - 32 mmol/L _2 Calcium 8.9 - 10.3 mg/dL 9.6 9.9 9.5  Total Protein 6.0 - 8.3 g/dL - - 7.3  Total Bilirubin 0.3 - 1.2 mg/dL - - 0.5  Alkaline Phos 39 - 117 U/L - - 52  AST 0 - 37 U/L - - 18  ALT 0 - 35 U/L - - 20   CBC Latest Ref Rng & Units 06/07/2018 06/06/2018 03/08/2016  WBC 4.0 - 10.5 K/uL 7.0 6.9 5.2  Hemoglobin 12.0 - 15.0 g/dL  12.3 13.7 12.0  Hematocrit 36.0 - 46.0 % 37.5 41.4 37.2  Platelets 150 - 400 K/uL 299 359 256   Lipid Panel     Component Value Date/Time   CHOL 228 (H) 06/06/2018 1707   TRIG 156 (H) 06/06/2018 1707   HDL 43 06/06/2018 1707   CHOLHDL 5.3 06/06/2018 1707   VLDL 31 06/06/2018 1707   LDLCALC 154 (H) 06/06/2018 1707   HEMOGLOBIN A1C Lab Results  Component Value Date   HGBA1C 6.2 (H) 06/06/2018   MPG 131.24 06/06/2018   TSH Recent Labs    06/06/18 1707  TSH 2.064    Cardiac Studies:   Coronary angiogram 06/07/2018: Right dominant circulation, severely diffusely calcified vessels, previously placed stents in the proximal and mid RCA are widely patent with very mild neointimal hyperplasia in the proximal segment.  LAD is diffusely diseased and there is a focal 70 to 75% stenosis which is probably significant by FFR, however the LAD is diffusely diseased.  Lesion does not suggest unstable lesion and is heavily calcified.  Ramus and circumflex are moderately diffusely diseased and calcified without luminal high-grade stenosis.  Normal LV systolic function, normal LVEDP.  (right coronary artery (3.0 x 18 mm Cypher drug-eluting stent on 05/16/2003 patent).  Carotid artery duplex 03/01/2016: Stenosis in the right internal carotid artery (16-49%), in the upper end of spectrum. Antegrade vertebral artery flow. Follow up in one year is appropriate if clinically indicated. No significant change from 01/03/2013.  Stress test 03/08/2015: Patient exercised on Bruce protocol for 9 minutes and 28 seconds and achieved 106% of MPHR. Occasional PVCs during stress and recovery period, underlying LBBB.Marland Kitchen Normal blood pressure response. Poststress LVEF 60%. without additional wall motion abnormality.   Assessment   Pure hypercholesterolemia - Plan: Lipid Panel With LDL/HDL Ratio, Lipoprotein A (LPA),   Essential hypertension - Plan: CMP14+EGFR, amLODipine (NORVASC) 5 MG tablet,   Asymptomatic  stenosis of right carotid artery Continue surveillance.   Atherosclerosis of native coronary artery of native heart with stable angina pectoris ([place]) - Plan:   EKG 06/06/2018: Normal sinus rhythm at 71 bpm, normal axis, LBBB. No further analysis due to LBBB.  Recommendations:   Patient has severe calcific coronary artery disease involving all 3 vessels with of the mid RCA stent placed in 2005.  Mid LAD stenosis is probably hemodynamically significant but these was deferred due to severe diffuse calcification and need for aggressive medical therapy.  Ranexa has made significant improvement in symptoms of angina and presently has not used S/L NTG since then.  Continue the same.  With regard to hyperlipidemia, she is tolerating Praluent 75 mg, I will check labs, insurance is wanting [place] to change to [place].  Blood pressure is elevated today, I have added amlodipine 5 mg after dinner daily.  I'd like to see her back in 6 weeks to follow-up, she will need repeat lipid profile testing prior to her visit.    Adrian Prows, MD, [facility] 08/06/2018, 4:28 PM [facility] Pager: [phone] Office: [phone] If no answer Cell [phone]

## 2018-08-07 ENCOUNTER — Other Ambulatory Visit: Payer: Self-pay

## 2018-08-10 DIAGNOSIS — E7800 Pure hypercholesterolemia, unspecified: Secondary | ICD-10-CM | POA: Diagnosis not present

## 2018-08-10 DIAGNOSIS — I10 Essential (primary) hypertension: Secondary | ICD-10-CM | POA: Diagnosis not present

## 2018-08-12 LAB — CMP14+EGFR
ALT: 19 IU/L (ref 0–32)
AST: 19 IU/L (ref 0–40)
Albumin/Globulin Ratio: 2 (ref 1.2–2.2)
Albumin: 4.5 g/dL (ref 3.8–4.8)
Alkaline Phosphatase: 61 IU/L (ref 39–117)
BUN/Creatinine Ratio: 12 (ref 12–28)
BUN: 10 mg/dL (ref 8–27)
Bilirubin Total: 0.3 mg/dL (ref 0.0–1.2)
CO2: 25 mmol/L (ref 20–29)
Calcium: 9.8 mg/dL (ref 8.7–10.3)
Chloride: 104 mmol/L (ref 96–106)
Creatinine, Ser: 0.85 mg/dL (ref 0.57–1.00)
GFR calc Af Amer: 85 mL/min/{1.73_m2} (ref >59)
GFR calc non Af Amer: 74 mL/min/{1.73_m2} (ref >59)
Globulin, Total: 2.3 g/dL (ref 1.5–4.5)
Glucose: 117 mg/dL — ABNORMAL HIGH (ref 65–99)
Potassium: 5.1 mmol/L (ref 3.5–5.2)
Sodium: 142 mmol/L (ref 134–144)
Total Protein: 6.8 g/dL (ref 6.0–8.5)

## 2018-08-12 LAB — LIPOPROTEIN A (LPA): Lipoprotein (a): 25.6 nmol/L (ref <75.0)

## 2018-08-12 LAB — LIPID PANEL WITH LDL/HDL RATIO
Cholesterol, Total: 137 mg/dL (ref 100–199)
HDL: 49 mg/dL (ref >39)
LDL Calculated: 70 mg/dL (ref 0–99)
LDl/HDL Ratio: 1.4 ratio (ref 0.0–3.2)
Triglycerides: 91 mg/dL (ref 0–149)
VLDL Cholesterol Cal: 18 mg/dL (ref 5–40)

## 2018-08-14 ENCOUNTER — Other Ambulatory Visit: Payer: Self-pay

## 2018-08-14 DIAGNOSIS — E782 Mixed hyperlipidemia: Secondary | ICD-10-CM

## 2018-08-14 MED ORDER — REPATHA SURECLICK 140 MG/ML ~~LOC~~ SOAJ: 140.0000 mL | SUBCUTANEOUS | 11 refills | Status: DC

## 2018-08-17 ENCOUNTER — Other Ambulatory Visit: Payer: Self-pay

## 2018-08-17 ENCOUNTER — Ambulatory Visit (INDEPENDENT_AMBULATORY_CARE_PROVIDER_SITE_OTHER): Payer: BC Managed Care – PPO | Admitting: Cardiology

## 2018-08-17 DIAGNOSIS — E7800 Pure hypercholesterolemia, unspecified: Secondary | ICD-10-CM

## 2018-08-17 MED ORDER — EVOLOCUMAB 140 MG/ML ~~LOC~~ SOSY: 140.0000 mg | PREFILLED_SYRINGE | Freq: Once | SUBCUTANEOUS | Status: DC

## 2018-08-18 NOTE — Progress Notes (Signed)
Alirocumab (PRALUENT) 75 MG/ML SOAJ SQ right thigh delivered

## 2018-09-18 ENCOUNTER — Ambulatory Visit (INDEPENDENT_AMBULATORY_CARE_PROVIDER_SITE_OTHER): Payer: BC Managed Care – PPO | Admitting: Cardiology

## 2018-09-18 ENCOUNTER — Other Ambulatory Visit: Payer: Self-pay

## 2018-09-18 ENCOUNTER — Encounter: Payer: Self-pay | Admitting: Cardiology

## 2018-09-18 VITALS — BP 142/100 | HR 67 | Ht 63.0 in | Wt 152.0 lb

## 2018-09-18 DIAGNOSIS — I25118 Atherosclerotic heart disease of native coronary artery with other forms of angina pectoris: Secondary | ICD-10-CM | POA: Diagnosis not present

## 2018-09-18 DIAGNOSIS — I6523 Occlusion and stenosis of bilateral carotid arteries: Secondary | ICD-10-CM | POA: Diagnosis not present

## 2018-09-18 DIAGNOSIS — I10 Essential (primary) hypertension: Secondary | ICD-10-CM | POA: Diagnosis not present

## 2018-09-18 DIAGNOSIS — E782 Mixed hyperlipidemia: Secondary | ICD-10-CM | POA: Diagnosis not present

## 2018-09-18 MED ORDER — CARVEDILOL 6.25 MG PO TABS: 6.2500 mg | ORAL_TABLET | Freq: Two times a day (BID) | ORAL | 1 refills | Status: DC

## 2018-09-18 MED ORDER — RAMIPRIL 10 MG PO CAPS: 10.0000 mg | ORAL_CAPSULE | Freq: Every evening | ORAL | 1 refills | Status: DC

## 2018-09-18 MED ORDER — REPATHA SURECLICK 140 MG/ML ~~LOC~~ SOAJ: 140.0000 mL | SUBCUTANEOUS | 4 refills | Status: DC

## 2018-09-18 NOTE — Progress Notes (Signed)
Primary Physician/Referring:  Josetta Huddle, MD  Patient ID: Tiffany Ryan, female    DOB: 08-Feb-1955, 63 y.o.   MRN: [id number]  Chief Complaint  Patient presents with  . Hypertension  . Hyperlipidemia  . Follow-up    6week    HPI: Tiffany Ryan  is a 63 y.o. female  with Hypertension, hyperglycemia,family history of premature coronary artery disease, Hyperlipidemia, CAD S/P 3.0 x 18 mm Cypher drug-eluting stent to mid RCA placed on 05/16/2003. Due to unstable angina, was admitted to the hospital and underwent coronary angiography on 06/07/2018, Severe heavy calcification was evident in all 3 coronary vessels with patent RCA stent and has a 70% To 75% mid to distal LAD stenosis, Probably physiologically significant but was recommended medical therapy in view of diffuse calcification and uncontrolled hyperlipidemia.  She has uncontrolled hyperlipidemia and has only been able to tolerate 20 mg of Crestor without significant myalgia symptoms. She did not tolerate Zetia in past with abdominal discomfort.  Due to insurance reasons, she was started on Repatha and Praluent was discontinued.  She is tolerating this well, however due to myalgias she has reduced the dose of Crestor from 20 mg to 10 mg.  I had seen her on a virtual visit 6 weeks ago and I had added amlodipine 5 mg for uncontrolled hypertension.  She discontinued carvedilol and Altace due to starting amlodipine and now presents for follow-up.  She has lost about 12 pounds in weight over the past 3 to 4 months.  She feels well, has not had recurrence of angina pectoris.  She is also wondering whether she can stop Repatha and also Ranexa.  Since being on Ranexa, had noticed marked improvement in anginal symptoms.  Has not had to use sublingual nitroglycerin in the past 6 weeks.  Past Medical History:  Diagnosis Date  . High cholesterol   . Hyperlipemia 08/06/2018  . Hypertension     Past Surgical History:  Procedure Laterality Date  .  ABDOMINAL AORTOGRAM N/A 03/07/2016   Procedure: Abdominal Aortogram;  Surgeon: Adrian Prows, MD;  Location: [facility];  Service: Cardiovascular;  Laterality: N/A;  . CARDIAC SURGERY     stent x 2  . COLPOSCOPY  04/2018  . CORONARY ANGIOGRAPHY N/A 03/07/2016   Procedure: Coronary Angiography and possible PCI;  Surgeon: Adrian Prows, MD;  Location: [facility];  Service: Cardiovascular;  Laterality: N/A;  07:30 Case please  . LEFT HEART CATH AND CORONARY ANGIOGRAPHY N/A 06/07/2018   Procedure: LEFT HEART CATH AND CORONARY ANGIOGRAPHY;  Surgeon: Adrian Prows, MD;  Location: [facility];  Service: Cardiovascular;  Laterality: N/A;    Social History   Socioeconomic History  . Marital status: Married    Spouse name: Not on file  . Number of children: 2  . Years of education: Not on file  . Highest education level: Not on file  Occupational History  . Not on file  Social Needs  . Financial resource strain: Not on file  . Food insecurity    Worry: Not on file    Inability: Not on file  . Transportation needs    Medical: Not on file    Non-medical: Not on file  Tobacco Use  . Smoking status: Former Smoker    Packs/day: 1.00    Years: 25.00    Pack years: 25.00    Types: Cigarettes    Quit date: 2006    Years since quitting: 14.6  . Smokeless tobacco: Never  Used  Substance and Sexual Activity  . Alcohol use: Yes    Alcohol/week: 1.0 standard drinks    Types: 1 Glasses of wine per week    Comment: OCCASSIONAL  . Drug use: No  . Sexual activity: Not on file  Lifestyle  . Physical activity    Days per week: Not on file    Minutes per session: Not on file  . Stress: Not on file  Relationships  . Social Herbalist on phone: Not on file    Gets together: Not on file    Attends religious service: Not on file    Active member of club or organization: Not on file    Attends meetings of clubs or organizations: Not on file    Relationship status: Not on file   . Intimate partner violence    Fear of current or ex partner: Not on file    Emotionally abused: Not on file    Physically abused: Not on file    Forced sexual activity: Not on file  Other Topics Concern  . Not on file  Social History Narrative  . Not on file    Current Outpatient Medications on File Prior to Visit  Medication Sig Dispense Refill  . amLODipine (NORVASC) 5 MG tablet Take 1 tablet (5 mg total) by mouth daily after supper. 30 tablet 2  . aspirin 81 MG tablet Take 81 mg by mouth daily.    . Cholecalciferol (VITAMIN D) 2000 units CAPS Take 2,000 Units by mouth daily.    . CRESTOR 20 MG tablet Take 10 mg by mouth daily. Patient reduced due to myalgia  3  . ibuprofen (ADVIL,MOTRIN) 200 MG tablet Take 200 mg by mouth every 6 (six) hours as needed for moderate pain.    . magnesium 30 MG tablet Take 30 mg by mouth once a week.    . nitroGLYCERIN (NITROSTAT) 0.4 MG SL tablet Place 0.4 mg under the tongue every 5 (five) minutes as needed for chest pain.    . ranolazine (RANEXA) 1000 MG SR tablet Take 1 tablet (1,000 mg total) by mouth 2 (two) times daily. (Patient taking differently: Take 500 mg by mouth 2 (two) times daily. ) 60 tablet 1   Current Facility-Administered Medications on File Prior to Visit  Medication Dose Route Frequency Provider Last Rate Last Dose  . Evolocumab SOSY 140 mg  140 mg Subcutaneous Once Adrian Prows, MD       Review of Systems  Constitution: Positive for weight loss. Negative for decreased appetite, malaise/fatigue and weight gain.  Eyes: Negative for visual disturbance.  Cardiovascular: Positive for chest pain (occasional). Negative for claudication, dyspnea on exertion, leg swelling, orthopnea, palpitations and syncope.  Respiratory: Negative for hemoptysis, shortness of breath and wheezing.   Endocrine: Negative for cold intolerance and heat intolerance.  Hematologic/Lymphatic: Does not bruise/bleed easily.  Skin: Negative for nail changes.   Musculoskeletal: Positive for myalgias. Negative for muscle weakness.  Gastrointestinal: Negative for abdominal pain, change in bowel habit, nausea and vomiting.  Neurological: Negative for difficulty with concentration, dizziness, focal weakness and headaches.  Psychiatric/Behavioral: Negative for altered mental status and suicidal ideas.  All other systems reviewed and are negative.  Blood pressure (!) 142/100, pulse 67, height '5\' 3"'$  (1.6 m), weight 152 lb (68.9 kg), SpO2 96 %. Body mass index is 26.93 kg/m.   Physical Exam  Constitutional: She is oriented to person, place, and time. Vital signs are normal. She appears well-developed  and well-nourished.  HENT:  Head: Normocephalic and atraumatic.  Neck: Normal range of motion.  Cardiovascular: Normal rate, regular rhythm, normal heart sounds and intact distal pulses.  Pulses:      Carotid pulses are 2+ on the right side with bruit and 2+ on the left side.      Femoral pulses are 2+ on the right side and 2+ on the left side.      Popliteal pulses are 2+ on the right side and 2+ on the left side.       Dorsalis pedis pulses are 1+ on the right side and 1+ on the left side.       Posterior tibial pulses are 0 on the right side and 0 on the left side.  Pulmonary/Chest: Effort normal and breath sounds normal. No accessory muscle usage. No respiratory distress.  Abdominal: Soft. Bowel sounds are normal.  Musculoskeletal: Normal range of motion.  Neurological: She is alert and oriented to person, place, and time.  Skin: Skin is warm and dry.  Vitals reviewed.  Radiology: No results found.  Laboratory examination:   Labs 07/14/2017: Total cholesterol 237, triglycerides 100, HDL 51, LDL 166.  Serum glucose 121 mg, BUN 12, creatinine 0.77, eGFR 84 mL, potassium 5.2, CMP normal.  CMP Latest Ref Rng & Units 08/10/2018 06/06/2018 03/06/2016  Glucose 65 - 99 mg/dL 117(H) 135(H) 129(H)  BUN 8 - 27 mg/dL '10 10 9  '$ Creatinine 0.57 - 1.00 mg/dL  0.85 0.69 0.74  Sodium 134 - 144 mmol/L 142 141 140  Potassium 3.5 - 5.2 mmol/L 5.1 4.4 4.4  Chloride 96 - 106 mmol/L 104 105 106  CO2 20 - 29 mmol/L '25 25 25  '$ Calcium 8.7 - 10.3 mg/dL 9.8 9.6 9.9  Total Protein 6.0 - 8.5 g/dL 6.8 - -  Total Bilirubin 0.0 - 1.2 mg/dL 0.3 - -  Alkaline Phos 39 - 117 IU/L 61 - -  AST 0 - 40 IU/L 19 - -  ALT 0 - 32 IU/L 19 - -   CBC Latest Ref Rng & Units 06/07/2018 06/06/2018 03/08/2016  WBC 4.0 - 10.5 K/uL 7.0 6.9 5.2  Hemoglobin 12.0 - 15.0 g/dL 12.3 13.7 12.0  Hematocrit 36.0 - 46.0 % 37.5 41.4 37.2  Platelets 150 - 400 K/uL 299 359 256   Lipid Panel     Component Value Date/Time   CHOL 137 08/10/2018 0815   TRIG 91 08/10/2018 0815   HDL 49 08/10/2018 0815   CHOLHDL 5.3 06/06/2018 1707   VLDL 31 06/06/2018 1707   LDLCALC 70 08/10/2018 0815   HEMOGLOBIN A1C Lab Results  Component Value Date   HGBA1C 6.2 (H) 06/06/2018   MPG 131.24 06/06/2018   TSH Recent Labs    06/06/18 1707  TSH 2.064    Cardiac Studies:   Stress test 03/08/2015: Patient exercised on Bruce protocol for 9 minutes and 28 seconds and achieved 106% of MPHR. Occasional PVCs during stress and recovery period, underlying LBBB.Marland Kitchen Normal blood pressure response. Poststress LVEF 60%. without additional wall motion abnormality.  Coronary angiogram 06/07/2018: Right dominant circulation, severely diffusely calcified vessels, previously placed stents in the proximal and mid RCA are widely patent with very mild neointimal hyperplasia in the proximal segment.  LAD is diffusely diseased and there is a focal 70 to 75% stenosis which is probably significant by FFR, however the LAD is diffusely diseased.  Lesion does not suggest unstable lesion and is heavily calcified.  Ramus and circumflex are moderately diffusely  diseased and calcified without luminal high-grade stenosis.  Normal LV systolic function, normal LVEDP.  (right coronary artery (3.0 x 18 mm Cypher drug-eluting stent on  05/16/2003 patent).  Carotid artery duplex  07/19/2018: Stenosis in the right internal carotid artery (50-69%). Stenosis in the left internal carotid artery (16-49%). Antegrade right vertebral artery flow. Antegrade left vertebral artery flow. Compared to the study done on 03/01/2016, right ACS stenosis has increased from less than 50%.  Left ICA stenosis is new. Study suggest progression of extracranial cerebral vascular disease. Follow up in six months is appropriate if clinically indicated.  Assessment     ICD-10-CM   1. Atherosclerosis of native coronary artery of native heart with stable angina pectoris ([place])  I25.118 carvedilol (COREG) 6.25 MG tablet    Evolocumab (REPATHA SURECLICK) 282 MG/ML SOAJ  2. Essential hypertension  I10 ramipril (ALTACE) 10 MG capsule  3. Mixed hyperlipidemia  E78.2 Evolocumab (REPATHA SURECLICK) 081 MG/ML SOAJ  4. Asymptomatic bilateral carotid artery stenosis  I65.23    EKG 06/06/2018: Normal sinus rhythm at 71 bpm, normal axis, LBBB. No further analysis due to LBBB.  Recommendations:   Patient has severe calcific coronary artery disease involving all 3 vessels with of the mid RCA stent placed in 2005.  Mid LAD stenosis is probably hemodynamically significant but these was deferred due to severe diffuse calcification and need for aggressive medical therapy.  Ranexa has made significant improvement in symptoms of angina and presently has not used S/L NTG since then.  Patient has now reduced Crestor from 20 mg to 10 mg due to myalgias.  But she has been taking Repatha regularly, had to be changed from [place] due to insurance reasons.  Advised her that she cannot come off of Repatha or the Crestor from 10 mg in view of significant coronary artery disease.  Her carotid artery stenosis is also worsened by recent duplex.  There is progression of disease on the right and new left ICA stenosis of less than 50%.  Today her blood pressure is uncontrolled on the  virtual visit 6 weeks ago, I have added amlodipine, since then she discontinued carvedilol and also ramipril which I have restarted.  Advised her not to stop any of the medications without discussing with me.  With regard to Ranexa, advised her that she could certainly stop it however if there is recurrence of angina she needs to go back on it.  I would like to see her back in 3 months.  She will need repeat lipid profile test to follow-up on change from Praluent to [place], patient has a complete physical scheduled with Dr. America Brown and wishes to have her labs done there.    Adrian Prows, MD, [facility] 09/18/2018, 5:40 PM Pager: [phone] Office: [phone] If no answer Cell [phone]

## 2018-10-17 ENCOUNTER — Telehealth: Payer: Self-pay

## 2018-10-17 NOTE — Telephone Encounter (Signed)
Pt called and stated that she has had a sore throat with muscle aches due that the medication Repatha.140 every 14 days. Pt would like for you to call her back. Please advise thank you.

## 2018-10-17 NOTE — Telephone Encounter (Signed)
In situation like this please tell her it is very unlikely to have the effect as the Repatha has no effect on the muscle. She probably has some viral syndrome. Wait and watch, but to continue the medication and if symptoms persist in 2 weeks to call.

## 2018-10-18 NOTE — Telephone Encounter (Signed)
Called pt no answer, left vm about the message below.

## 2018-10-31 DIAGNOSIS — Z0000 Encounter for general adult medical examination without abnormal findings: Secondary | ICD-10-CM | POA: Diagnosis not present

## 2018-10-31 DIAGNOSIS — E785 Hyperlipidemia, unspecified: Secondary | ICD-10-CM | POA: Diagnosis not present

## 2018-10-31 DIAGNOSIS — R7309 Other abnormal glucose: Secondary | ICD-10-CM | POA: Diagnosis not present

## 2018-10-31 DIAGNOSIS — R079 Chest pain, unspecified: Secondary | ICD-10-CM | POA: Diagnosis not present

## 2018-10-31 DIAGNOSIS — M549 Dorsalgia, unspecified: Secondary | ICD-10-CM | POA: Diagnosis not present

## 2018-10-31 DIAGNOSIS — I10 Essential (primary) hypertension: Secondary | ICD-10-CM | POA: Diagnosis not present

## 2018-10-31 DIAGNOSIS — E559 Vitamin D deficiency, unspecified: Secondary | ICD-10-CM | POA: Diagnosis not present

## 2018-12-17 ENCOUNTER — Ambulatory Visit: Payer: BC Managed Care – PPO | Admitting: Cardiology

## 2018-12-19 ENCOUNTER — Ambulatory Visit: Payer: BC Managed Care – PPO | Admitting: Cardiology

## 2019-01-08 ENCOUNTER — Ambulatory Visit: Payer: BC Managed Care – PPO | Admitting: Cardiology

## 2019-01-15 ENCOUNTER — Other Ambulatory Visit: Payer: Self-pay

## 2019-01-15 ENCOUNTER — Encounter: Payer: Self-pay | Admitting: Cardiology

## 2019-01-15 ENCOUNTER — Ambulatory Visit (INDEPENDENT_AMBULATORY_CARE_PROVIDER_SITE_OTHER): Payer: BC Managed Care – PPO | Admitting: Cardiology

## 2019-01-15 VITALS — BP 174/80 | HR 74 | Temp 98.2°F | Ht 62.0 in | Wt 152.0 lb

## 2019-01-15 DIAGNOSIS — E7800 Pure hypercholesterolemia, unspecified: Secondary | ICD-10-CM

## 2019-01-15 DIAGNOSIS — I25118 Atherosclerotic heart disease of native coronary artery with other forms of angina pectoris: Secondary | ICD-10-CM

## 2019-01-15 DIAGNOSIS — I6523 Occlusion and stenosis of bilateral carotid arteries: Secondary | ICD-10-CM

## 2019-01-15 DIAGNOSIS — I10 Essential (primary) hypertension: Secondary | ICD-10-CM

## 2019-01-15 MED ORDER — VALSARTAN-HYDROCHLOROTHIAZIDE 160-12.5 MG PO TABS: 1.0000 | ORAL_TABLET | ORAL | 2 refills | Status: DC

## 2019-01-15 MED ORDER — NEBIVOLOL HCL 10 MG PO TABS: 10.0000 mg | ORAL_TABLET | Freq: Every day | ORAL | 1 refills | Status: DC

## 2019-01-15 MED ORDER — RANOLAZINE ER 500 MG PO TB12: 500.0000 mg | ORAL_TABLET | Freq: Two times a day (BID) | ORAL | 3 refills | Status: DC | PRN

## 2019-01-15 NOTE — Progress Notes (Signed)
Primary Physician/Referring:  Josetta Huddle, MD  Patient ID: Tiffany Ryan, female    DOB: [birth year], 63 y.o.   MRN: [id number]  Chief Complaint  Patient presents with  . Hypertension  . Hyperlipidemia  . Coronary Artery Disease    HPI: Tiffany Ryan  is a 63 y.o. female  with Hypertension, hyperglycemia,family history of premature coronary artery disease, Hyperlipidemia, CAD S/P 3.0 x 18 mm Cypher drug-eluting stent to mid RCA placed on 05/16/2003. Coronary angiography on 06/07/2018, Severe heavy calcification was evident in all 3 coronary vessels with patent RCA stent and has a 70% To 75% mid to distal LAD stenosis, Probably physiologically significant but was recommended medical therapy in view of diffuse calcification and uncontrolled hyperlipidemia.  She has uncontrolled hyperlipidemia, hypertension and multiple medication intolerances.  Had only been able to tolerate 20 mg of Crestor without significant myalgia symptoms. She did not tolerate Zetia in past with abdominal discomfort. But now back on it since Nov 2020 and stopped Repatha due to neck tightness. Due to insurance reasons, she was started on Repatha and Praluent was discontinued.  She did not have problems with Praluent.  She now c/o Severe headaches with amlodipine and also has developed severe cough with ramipril. Since being on Ranexa, had noticed marked improvement in anginal symptoms.  Has not had to use sublingual nitroglycerin.  Past Medical History:  Diagnosis Date  . CAD (coronary artery disease), native coronary artery 01/20/2019  . High cholesterol   . Hyperlipemia 08/06/2018  . Hypertension   . Unstable angina ([place]) 03/06/2016    Past Surgical History:  Procedure Laterality Date  . ABDOMINAL AORTOGRAM N/A 03/07/2016   Procedure: Abdominal Aortogram;  Surgeon: Adrian Prows, MD;  Location: [facility];  Service: Cardiovascular;  Laterality: N/A;  . CARDIAC SURGERY     stent x 2  . COLPOSCOPY  04/2018  . CORONARY  ANGIOGRAPHY N/A 03/07/2016   Procedure: Coronary Angiography and possible PCI;  Surgeon: Adrian Prows, MD;  Location: [facility];  Service: Cardiovascular;  Laterality: N/A;  07:30 Case please  . LEFT HEART CATH AND CORONARY ANGIOGRAPHY N/A 06/07/2018   Procedure: LEFT HEART CATH AND CORONARY ANGIOGRAPHY;  Surgeon: Adrian Prows, MD;  Location: [facility];  Service: Cardiovascular;  Laterality: N/A;    Social History   Socioeconomic History  . Marital status: Married    Spouse name: Not on file  . Number of children: 2  . Years of education: Not on file  . Highest education level: Not on file  Occupational History  . Not on file  Tobacco Use  . Smoking status: Former Smoker    Packs/day: 1.00    Years: 25.00    Pack years: 25.00    Types: Cigarettes    Quit date: 2006    Years since quitting: 14.9  . Smokeless tobacco: Never Used  Substance and Sexual Activity  . Alcohol use: Yes    Alcohol/week: 1.0 standard drinks    Types: 1 Glasses of wine per week    Comment: OCCASSIONAL  . Drug use: No  . Sexual activity: Not on file  Other Topics Concern  . Not on file  Social History Narrative  . Not on file   Social Determinants of Health   Financial Resource Strain:   . Difficulty of Paying Living Expenses: Not on file  Food Insecurity:   . Worried About Charity fundraiser in the Last Year: Not on file  . Ran  Out of Food in the Last Year: Not on file  Transportation Needs:   . Lack of Transportation (Medical): Not on file  . Lack of Transportation (Non-Medical): Not on file  Physical Activity:   . Days of Exercise per Week: Not on file  . Minutes of Exercise per Session: Not on file  Stress:   . Feeling of Stress : Not on file  Social Connections:   . Frequency of Communication with Friends and Family: Not on file  . Frequency of Social Gatherings with Friends and Family: Not on file  . Attends Religious Services: Not on file  . Active Member of Clubs or  Organizations: Not on file  . Attends Archivist Meetings: Not on file  . Marital Status: Not on file  Intimate Partner Violence:   . Fear of Current or Ex-Partner: Not on file  . Emotionally Abused: Not on file  . Physically Abused: Not on file  . Sexually Abused: Not on file    Current Outpatient Medications on File Prior to Visit  Medication Sig Dispense Refill  . aspirin 81 MG tablet Take 81 mg by mouth daily.    . Cholecalciferol (VITAMIN D) 2000 units CAPS Take 2,000 Units by mouth daily.    . CRESTOR 20 MG tablet Take 10 mg by mouth daily. Patient reduced due to myalgia  3  . ezetimibe (ZETIA) 10 MG tablet Take 10 mg by mouth daily.    Marland Kitchen ibuprofen (ADVIL,MOTRIN) 200 MG tablet Take 200 mg by mouth every 6 (six) hours as needed for moderate pain.    . magnesium 30 MG tablet Take 30 mg by mouth once a week.    . nitroGLYCERIN (NITROSTAT) 0.4 MG SL tablet Place 0.4 mg under the tongue every 5 (five) minutes as needed for chest pain.     No current facility-administered medications on file prior to visit.   Review of Systems  Constitution: Negative for decreased appetite, malaise/fatigue, weight gain and weight loss.  Eyes: Negative for visual disturbance.  Cardiovascular: Positive for chest pain (occasional). Negative for claudication, dyspnea on exertion, leg swelling, orthopnea, palpitations and syncope.  Respiratory: Negative for hemoptysis, shortness of breath and wheezing.   Endocrine: Negative for cold intolerance and heat intolerance.  Hematologic/Lymphatic: Does not bruise/bleed easily.  Skin: Negative for nail changes.  Musculoskeletal: Positive for myalgias. Negative for muscle weakness.  Gastrointestinal: Negative for abdominal pain, change in bowel habit, nausea and vomiting.  Neurological: Negative for difficulty with concentration, dizziness, focal weakness and headaches.  Psychiatric/Behavioral: Negative for altered mental status and suicidal ideas.  All  other systems reviewed and are negative.  Blood pressure (!) 174/80, pulse 74, temperature 98.2 F (36.8 C), height 5' 2" (1.575 m), weight 152 lb (68.9 kg), SpO2 98 %. Body mass index is 27.8 kg/m.  \ Vitals with BMI 01/15/2019 09/18/2018 08/06/2018  Height 5' 2" 5' 3" 5' 3"  Weight 152 lbs 152 lbs 150 lbs  BMI 27.79 07.61 51.83  Systolic 437 357 897  Diastolic 80 847 96  Pulse 74 67 76     Physical Exam  Constitutional: She is oriented to person, place, and time. Vital signs are normal. She appears well-developed and well-nourished.  HENT:  Head: Normocephalic and atraumatic.  Cardiovascular: Normal rate, regular rhythm, normal heart sounds and intact distal pulses.  Pulses:      Carotid pulses are 2+ on the right side with bruit and 2+ on the left side.  Femoral pulses are 2+ on the right side and 2+ on the left side.      Popliteal pulses are 2+ on the right side and 2+ on the left side.       Dorsalis pedis pulses are 1+ on the right side and 1+ on the left side.       Posterior tibial pulses are 0 on the right side and 0 on the left side.  Pulmonary/Chest: Effort normal and breath sounds normal. No accessory muscle usage. No respiratory distress.  Abdominal: Soft. Bowel sounds are normal.  Musculoskeletal:        General: Normal range of motion.     Cervical back: Normal range of motion.  Neurological: She is alert and oriented to person, place, and time.  Skin: Skin is warm and dry.  Vitals reviewed.  Radiology: No results found.  Laboratory examination:   Labs 07/14/2017: Total cholesterol 237, triglycerides 100, HDL 51, LDL 166.  Serum glucose 121 mg, BUN 12, creatinine 0.77, eGFR 84 mL, potassium 5.2, CMP normal.  CMP Latest Ref Rng & Units 08/10/2018 06/06/2018 03/06/2016  Glucose 65 - 99 mg/dL 117(H) 135(H) 129(H)  BUN 8 - 27 mg/dL _0 Creatinine 0.57 - 1.00 mg/dL 0.85 0.69 0.74  Sodium 134 - 144 mmol/L 142 141 140  Potassium 3.5 - 5.2 mmol/L 5.1 4.4 4.4   Chloride 96 - 106 mmol/L 104 105 106  CO2 20 - 29 mmol/L _1 Calcium 8.7 - 10.3 mg/dL 9.8 9.6 9.9  Total Protein 6.0 - 8.5 g/dL 6.8 - -  Total Bilirubin 0.0 - 1.2 mg/dL 0.3 - -  Alkaline Phos 39 - 117 IU/L 61 - -  AST 0 - 40 IU/L 19 - -  ALT 0 - 32 IU/L 19 - -   CBC Latest Ref Rng & Units 06/07/2018 06/06/2018 03/08/2016  WBC 4.0 - 10.5 K/uL 7.0 6.9 5.2  Hemoglobin 12.0 - 15.0 g/dL 12.3 13.7 12.0  Hematocrit 36.0 - 46.0 % 37.5 41.4 37.2  Platelets 150 - 400 K/uL 299 359 256   Lipid Panel     Component Value Date/Time   CHOL 158 01/16/2019 0819   TRIG 89 01/16/2019 0819   HDL 48 01/16/2019 0819   CHOLHDL 5.3 06/06/2018 1707   VLDL 31 06/06/2018 1707   LDLCALC 93 01/16/2019 0819   LDLDIRECT 90 01/16/2019 0819   HEMOGLOBIN A1C Lab Results  Component Value Date   HGBA1C 6.2 (H) 06/06/2018   MPG 131.24 06/06/2018   TSH Recent Labs    06/06/18 1707  TSH 2.064   Cardiac Studies:   Stress test 03/08/2015: Patient exercised on Bruce protocol for 9 minutes and 28 seconds and achieved 106% of MPHR. Occasional PVCs during stress and recovery period, underlying LBBB.Marland Kitchen Normal blood pressure response. Poststress LVEF 60%. without additional wall motion abnormality.  Coronary angiogram 06/07/2018: Right dominant circulation, severely diffusely calcified vessels, previously placed stents in the proximal and mid RCA are widely patent with very mild neointimal hyperplasia in the proximal segment.  LAD is diffusely diseased and there is a focal 70 to 75% stenosis which is probably significant by FFR, however the LAD is diffusely diseased.  Lesion does not suggest unstable lesion and is heavily calcified.  Ramus and circumflex are moderately diffusely diseased and calcified without luminal high-grade stenosis.  Normal LV systolic function, normal LVEDP.  (right coronary artery (3.0 x 18 mm Cypher drug-eluting stent on 05/16/2003 patent).  Carotid artery duplex  07/19/2018:  Stenosis  in the right internal carotid artery (50-69%). Stenosis in the left internal carotid artery (16-49%). Antegrade right vertebral artery flow. Antegrade left vertebral artery flow. Compared to the study done on 03/01/2016, right ACS stenosis has increased from less than 50%.  Left ICA stenosis is new. Study suggest progression of extracranial cerebral vascular disease. Follow up in six months is appropriate if clinically indicated.  Assessment     ICD-10-CM   1. Atherosclerosis of native coronary artery of native heart with stable angina pectoris (HCC)  I25.118 EKG 12-Lead    ranolazine (RANEXA) 500 MG 12 hr tablet  2. Essential hypertension  I10 valsartan-hydrochlorothiazide (DIOVAN HCT) 160-12.5 MG tablet    nebivolol (BYSTOLIC) 10 MG tablet  3. Hypercholesteremia  E78.00 LDL cholesterol, direct    Lipid Panel With LDL/HDL Ratio  4. Asymptomatic bilateral carotid artery stenosis  I65.23 PCV CAROTID DUPLEX (BILATERAL)  5. Coronary artery disease of native artery of native heart with stable angina pectoris ([place])  I25.118    EKG 01/15/2019: Normal sinus rhythm at rate of 65 bpm, left atrial enlargement, left bundle branch block.  Further analysis.  No significant change from 06/06/2018.  Recommendations:   Tiffany Ryan  is a 62 y.o. female  with Hypertension, hyperglycemia,family history of premature coronary artery disease, Hyperlipidemia, CAD S/P 3.0 x 18 mm Cypher drug-eluting stent to mid RCA placed on 05/16/2003. Coronary angiography on 06/07/2018, Severe heavy calcification was evident in all 3 coronary vessels with patent RCA stent and has a 70% To 75% mid to distal LAD stenosis, Probably physiologically significant but was recommended medical therapy in view of diffuse calcification and uncontrolled hyperlipidemia.  Symptoms of angina has improved and she is now taking Ranexa but only on a PRN basis and requests refills which I refilled.  She has multiple medication intolerances, will  discontinue Coreg due to efficacy and switched to Bystolic 10 mg daily, she probably needs 20 mg.  I'll also discontinued amlodipine due to headache.  With regard to hyperlipidemia, she is now back on Crestor and Zetia but she has started to have myalgias again and would like to discontinue this.  She did tolerate Praluent but is now not able to tolerate Repatha, we could try prior authorization, but patient would like to see labs first.  We'll obtain baseline labs to see how she is doing presently. Add: Her lipids are still uncontrolled and will discuss on her OV.   With regard to carotid stenosis, needs carotid artery duplex for surveillance.  I'll see her back in 6 weeks.  Adrian Prows, MD, [facility] 01/20/2019, 6:20 PM Pager: [phone] Office: [phone] If no answer Cell [phone]

## 2019-01-16 DIAGNOSIS — E7800 Pure hypercholesterolemia, unspecified: Secondary | ICD-10-CM | POA: Diagnosis not present

## 2019-01-17 LAB — LIPID PANEL WITH LDL/HDL RATIO
Cholesterol, Total: 158 mg/dL (ref 100–199)
HDL: 48 mg/dL (ref >39)
LDL Chol Calc (NIH): 93 mg/dL (ref 0–99)
LDL/HDL Ratio: 1.9 ratio (ref 0.0–3.2)
Triglycerides: 89 mg/dL (ref 0–149)
VLDL Cholesterol Cal: 17 mg/dL (ref 5–40)

## 2019-01-17 LAB — LDL CHOLESTEROL, DIRECT: LDL Direct: 90 mg/dL (ref 0–99)

## 2019-01-20 ENCOUNTER — Encounter: Payer: Self-pay | Admitting: Cardiology

## 2019-01-20 DIAGNOSIS — I2510 Atherosclerotic heart disease of native coronary artery without angina pectoris: Secondary | ICD-10-CM

## 2019-01-20 DIAGNOSIS — I25118 Atherosclerotic heart disease of native coronary artery with other forms of angina pectoris: Secondary | ICD-10-CM | POA: Insufficient documentation

## 2019-01-20 HISTORY — DX: Atherosclerotic heart disease of native coronary artery without angina pectoris: I25.10

## 2019-02-01 ENCOUNTER — Other Ambulatory Visit: Payer: Self-pay

## 2019-02-01 ENCOUNTER — Ambulatory Visit (INDEPENDENT_AMBULATORY_CARE_PROVIDER_SITE_OTHER): Payer: BC Managed Care – PPO

## 2019-02-01 DIAGNOSIS — I6523 Occlusion and stenosis of bilateral carotid arteries: Secondary | ICD-10-CM | POA: Diagnosis not present

## 2019-02-03 ENCOUNTER — Other Ambulatory Visit: Payer: Self-pay | Admitting: Cardiology

## 2019-02-03 DIAGNOSIS — I6523 Occlusion and stenosis of bilateral carotid arteries: Secondary | ICD-10-CM

## 2019-02-12 DIAGNOSIS — E785 Hyperlipidemia, unspecified: Secondary | ICD-10-CM | POA: Diagnosis not present

## 2019-02-12 DIAGNOSIS — R079 Chest pain, unspecified: Secondary | ICD-10-CM | POA: Diagnosis not present

## 2019-02-12 DIAGNOSIS — R739 Hyperglycemia, unspecified: Secondary | ICD-10-CM | POA: Diagnosis not present

## 2019-02-12 DIAGNOSIS — Z8679 Personal history of other diseases of the circulatory system: Secondary | ICD-10-CM | POA: Diagnosis not present

## 2019-02-13 ENCOUNTER — Other Ambulatory Visit: Payer: Self-pay

## 2019-02-13 MED ORDER — CRESTOR 20 MG PO TABS: 10.0000 mg | ORAL_TABLET | Freq: Every day | ORAL | 3 refills | Status: DC

## 2019-02-13 MED ORDER — EZETIMIBE 10 MG PO TABS: 10.0000 mg | ORAL_TABLET | Freq: Every day | ORAL | 3 refills | Status: AC

## 2019-02-23 ENCOUNTER — Emergency Department (HOSPITAL_COMMUNITY)
Admission: EM | Admit: 2019-02-23 | Discharge: 2019-02-23 | Disposition: A | Discharge status: Home / Self Care | Payer: BC Managed Care – PPO | Attending: Emergency Medicine | Admitting: Emergency Medicine

## 2019-02-23 ENCOUNTER — Other Ambulatory Visit: Payer: Self-pay

## 2019-02-23 ENCOUNTER — Encounter (HOSPITAL_COMMUNITY): Payer: Self-pay | Admitting: *Deleted

## 2019-02-23 ENCOUNTER — Emergency Department (HOSPITAL_COMMUNITY): Payer: BC Managed Care – PPO

## 2019-02-23 DIAGNOSIS — R0989 Other specified symptoms and signs involving the circulatory and respiratory systems: Secondary | ICD-10-CM | POA: Diagnosis not present

## 2019-02-23 DIAGNOSIS — I119 Hypertensive heart disease without heart failure: Secondary | ICD-10-CM | POA: Diagnosis not present

## 2019-02-23 DIAGNOSIS — M7989 Other specified soft tissue disorders: Secondary | ICD-10-CM | POA: Diagnosis not present

## 2019-02-23 DIAGNOSIS — R42 Dizziness and giddiness: Secondary | ICD-10-CM

## 2019-02-23 DIAGNOSIS — R110 Nausea: Secondary | ICD-10-CM | POA: Insufficient documentation

## 2019-02-23 DIAGNOSIS — R2232 Localized swelling, mass and lump, left upper limb: Secondary | ICD-10-CM | POA: Insufficient documentation

## 2019-02-23 DIAGNOSIS — Z79899 Other long term (current) drug therapy: Secondary | ICD-10-CM | POA: Insufficient documentation

## 2019-02-23 DIAGNOSIS — R2231 Localized swelling, mass and lump, right upper limb: Secondary | ICD-10-CM | POA: Diagnosis not present

## 2019-02-23 DIAGNOSIS — R5381 Other malaise: Secondary | ICD-10-CM | POA: Diagnosis not present

## 2019-02-23 DIAGNOSIS — R9431 Abnormal electrocardiogram [ECG] [EKG]: Secondary | ICD-10-CM | POA: Diagnosis not present

## 2019-02-23 DIAGNOSIS — R2243 Localized swelling, mass and lump, lower limb, bilateral: Secondary | ICD-10-CM | POA: Diagnosis not present

## 2019-02-23 DIAGNOSIS — I2510 Atherosclerotic heart disease of native coronary artery without angina pectoris: Secondary | ICD-10-CM | POA: Diagnosis not present

## 2019-02-23 DIAGNOSIS — Z20828 Contact with and (suspected) exposure to other viral communicable diseases: Secondary | ICD-10-CM | POA: Diagnosis not present

## 2019-02-23 LAB — COMPREHENSIVE METABOLIC PANEL
ALT: 28 U/L (ref 0–44)
AST: 23 U/L (ref 15–41)
Albumin: 3.7 g/dL (ref 3.5–5.0)
Alkaline Phosphatase: 47 U/L (ref 38–126)
Anion gap: 11 (ref 5–15)
BUN: 15 mg/dL (ref 8–23)
CO2: 28 mmol/L (ref 22–32)
Calcium: 9.2 mg/dL (ref 8.9–10.3)
Chloride: 100 mmol/L (ref 98–111)
Creatinine, Ser: 0.83 mg/dL (ref 0.44–1.00)
GFR calc Af Amer: >60 mL/min (ref >60)
GFR calc non Af Amer: >60 mL/min (ref >60)
Glucose, Bld: 143 mg/dL — ABNORMAL HIGH (ref 70–99)
Potassium: 4.7 mmol/L (ref 3.5–5.1)
Sodium: 139 mmol/L (ref 135–145)
Total Bilirubin: 0.4 mg/dL (ref 0.3–1.2)
Total Protein: 6.7 g/dL (ref 6.5–8.1)

## 2019-02-23 LAB — CBC
HCT: 43.9 % (ref 36.0–46.0)
Hemoglobin: 14.6 g/dL (ref 12.0–15.0)
MCH: 31.5 pg (ref 26.0–34.0)
MCHC: 33.3 g/dL (ref 30.0–36.0)
MCV: 94.8 fL (ref 80.0–100.0)
Platelets: 290 10*3/uL (ref 150–400)
RBC: 4.63 MIL/uL (ref 3.87–5.11)
RDW: 12.3 % (ref 11.5–15.5)
WBC: 5.3 10*3/uL (ref 4.0–10.5)
nRBC: 0 % (ref 0.0–0.2)

## 2019-02-23 LAB — URINALYSIS, ROUTINE W REFLEX MICROSCOPIC
Bilirubin Urine: NEGATIVE
Glucose, UA: NEGATIVE mg/dL
Hgb urine dipstick: NEGATIVE
Ketones, ur: NEGATIVE mg/dL
Leukocytes,Ua: NEGATIVE
Nitrite: NEGATIVE
Protein, ur: NEGATIVE mg/dL
Specific Gravity, Urine: 1.011 (ref 1.005–1.030)
pH: 7 (ref 5.0–8.0)

## 2019-02-23 LAB — LIPASE, BLOOD: Lipase: 21 U/L (ref 11–51)

## 2019-02-23 MED ORDER — SODIUM CHLORIDE 0.9% FLUSH: 3.0000 mL | Freq: Once | INTRAVENOUS | Status: DC

## 2019-02-23 MED ORDER — ONDANSETRON 4 MG PO TBDP
4.0000 mg | ORAL_TABLET | Freq: Once | ORAL | Status: AC
  Administered 2019-02-23: 4 mg via ORAL
  Filled 2019-02-23: qty 1

## 2019-02-23 NOTE — ED Provider Notes (Signed)
[facility] EMERGENCY DEPARTMENT Provider Note   CSN: [id number] Arrival date & time: 02/23/19  1524     History Chief Complaint  Patient presents with  . Dizziness  . Foot Pain    Tiffany Ryan is a 64 y.o. female.  64yo F w/ PMH including CAD, HTN, HLD who p/w swelling in hands and feet as well as dizziness. She woke up at 6am w/ swelling to b/l hands and feet. She's never had swelling like this before. No major changes in salt intake. She also felt a headache and dizziness so she got tested today for COVID, no results yet. With the headache, she felt some numbness on the right side of her head, which usually happens when she gets headache. This has since resolved. She reports nausea since yesterday and hasn't eaten anything since lunch yesterday. No abd pain, diarrhea, vomiting, or urinary symptoms. No CP or breathing problems. She notes occasional cough. Husband currently has cough and also is awaiting COVID-19 test results.  The history is provided by the patient.  Dizziness Foot Pain       Past Medical History:  Diagnosis Date  . CAD (coronary artery disease), native coronary artery 01/20/2019  . High cholesterol   . Hyperlipemia 08/06/2018  . Hypertension   . Unstable angina ([place]) 03/06/2016    Patient Active Problem List   Diagnosis Date Noted  . CAD (coronary artery disease), native coronary artery 01/20/2019  . Hyperlipemia 08/06/2018  . Hypertension 06/21/2018    Past Surgical History:  Procedure Laterality Date  . ABDOMINAL AORTOGRAM N/A 03/07/2016   Procedure: Abdominal Aortogram;  Surgeon: Adrian Prows, MD;  Location: [facility];  Service: Cardiovascular;  Laterality: N/A;  . CARDIAC SURGERY     stent x 2  . COLPOSCOPY  04/2018  . CORONARY ANGIOGRAPHY N/A 03/07/2016   Procedure: Coronary Angiography and possible PCI;  Surgeon: Adrian Prows, MD;  Location: [facility];  Service: Cardiovascular;  Laterality: N/A;  07:30 Case please    . LEFT HEART CATH AND CORONARY ANGIOGRAPHY N/A 06/07/2018   Procedure: LEFT HEART CATH AND CORONARY ANGIOGRAPHY;  Surgeon: Adrian Prows, MD;  Location: [facility];  Service: Cardiovascular;  Laterality: N/A;     OB History   No obstetric history on file.     Family History  Problem Relation Age of Onset  . Stroke Father     Social History   Tobacco Use  . Smoking status: Former Smoker    Packs/day: 1.00    Years: 25.00    Pack years: 25.00    Types: Cigarettes    Quit date: 2006    Years since quitting: 15.0  . Smokeless tobacco: Never Used  Substance Use Topics  . Alcohol use: Yes    Alcohol/week: 1.0 standard drinks    Types: 1 Glasses of wine per week    Comment: OCCASSIONAL  . Drug use: No    Home Medications Prior to Admission medications   Medication Sig Start Date End Date Taking? Authorizing Provider  aspirin 81 MG tablet Take 81 mg by mouth daily.    [provider]  Cholecalciferol (VITAMIN D) 2000 units CAPS Take 2,000 Units by mouth daily.    [provider]  CRESTOR 20 MG tablet Take 0.5 tablets (10 mg total) by mouth daily. Patient reduced due to myalgia 02/13/19   Adrian Prows, MD  ezetimibe (ZETIA) 10 MG tablet Take 1 tablet (10 mg total) by mouth  daily. 02/13/19   Yates Decamp, MD  ibuprofen (ADVIL,MOTRIN) 200 MG tablet Take 200 mg by mouth every 6 (six) hours as needed for moderate pain.    [provider]  magnesium 30 MG tablet Take 30 mg by mouth once a week.    [provider]  nebivolol (BYSTOLIC) 10 MG tablet Take 1 tablet (10 mg total) by mouth daily. 01/15/19   Yates Decamp, MD  nitroGLYCERIN (NITROSTAT) 0.4 MG SL tablet Place 0.4 mg under the tongue every 5 (five) minutes as needed for chest pain.    [provider]  ranolazine (RANEXA) 500 MG 12 hr tablet Take 1 tablet (500 mg total) by mouth 2 (two) times daily as needed. 01/15/19   Yates Decamp, MD  valsartan-hydrochlorothiazide (DIOVAN HCT) 160-12.5  MG tablet Take 1 tablet by mouth every morning. 01/15/19   Yates Decamp, MD    Allergies    Ramipril, Amlodipine, and Repatha [evolocumab]  Review of Systems   Review of Systems  Neurological: Positive for dizziness.   All other systems reviewed and are negative except that which was mentioned in HPI  Physical Exam Updated Vital Signs BP 124/70 (BP Location: Right Arm)   Pulse 69   Temp 99.3 F (37.4 C) (Oral)   Resp 13   Ht 5\' 2"  (1.575 m)   Wt 68.9 kg   SpO2 94%   BMI 27.78 kg/m   Physical Exam Vitals and nursing note reviewed.  Constitutional:      General: She is not in acute distress.    Appearance: She is well-developed.  HENT:     Head: Normocephalic and atraumatic.  Eyes:     Conjunctiva/sclera: Conjunctivae normal.  Cardiovascular:     Rate and Rhythm: Normal rate and regular rhythm.     Heart sounds: Normal heart sounds. No murmur.  Pulmonary:     Effort: Pulmonary effort is normal.     Comments: Faint crackles LLL Abdominal:     General: Bowel sounds are normal. There is no distension.     Palpations: Abdomen is soft.     Tenderness: There is no abdominal tenderness.  Musculoskeletal:     Cervical back: Neck supple.     Comments: Very subtle edema of fingers b/l hands, no obvious edema of feet; no calf or leg tenderness/swelling  Skin:    General: Skin is warm and dry.     Findings: No rash.  Neurological:     Mental Status: She is alert and oriented to person, place, and time.     Comments: Fluent speech  Psychiatric:        Judgment: Judgment normal.     ED Results / Procedures / Treatments   Labs (all labs ordered are listed, but only abnormal results are displayed) Labs Reviewed  COMPREHENSIVE METABOLIC PANEL - Abnormal; Notable for the following components:      Result Value   Glucose, Bld 143 (*)    All other components within normal limits  LIPASE, BLOOD  CBC  URINALYSIS, ROUTINE W REFLEX MICROSCOPIC    EKG EKG  Interpretation  Date/Time:  Saturday February 23 2019 15:35:31 EST Ventricular Rate:  84 PR Interval:  162 QRS Duration: 142 QT Interval:  410 QTC Calculation: 484 R Axis:   33 Text Interpretation: Normal sinus rhythm Left bundle branch block Abnormal ECG No significant change since last tracing Confirmed by [id number] [phone]) on 02/23/2019 3:57:15 PM   Radiology DG Chest Port 1 View  Result Date: 02/23/2019  CLINICAL DATA:  Left lower lobe crackles on exam. EXAM: PORTABLE CHEST 1 VIEW COMPARISON:  03/06/2016 FINDINGS: Lungs are adequately inflated without lobar consolidation or effusion. Cardiomediastinal silhouette is normal. Crescent of air over the lateral aspect of the left hemidiaphragm likely in the adjacent air-filled colon. Remainder the exam is unchanged. IMPRESSION: No active disease. Electronically Signed   By: Elberta Fortis M.D.   On: 02/23/2019 17:28    Procedures Procedures (including critical care time)  Medications Ordered in ED Medications  ondansetron (ZOFRAN-ODT) disintegrating tablet 4 mg (4 mg Oral Given 02/23/19 1741)    ED Course  I have reviewed the triage vital signs and the nursing notes.  Pertinent labs & imaging results that were available during my care of the patient were reviewed by me and considered in my medical decision making (see chart for details).    MDM Rules/Calculators/A&P                      Pt comfortable on exam, no obvious edema of feet and only minimal puffiness of fingers. No focal joint swelling, no rash. No SOB. VS reassuring. Labs show no evidence of heart failure, liver disease, renal failure to explain edema. No other signs/ sx to suggest severe disease even if her outpatient COVID test does come back positive. I have discussed supportive measures including low salt diet and encouraged to f/u with PCP for further eval if symptoms continue. Return precautions reviewed.  Nyjae Hodge was evaluated in Emergency Department on  02/24/2019 for the symptoms described in the history of present illness. She was evaluated in the context of the global COVID-19 pandemic, which necessitated consideration that the patient might be at risk for infection with the SARS-CoV-2 virus that causes COVID-19. Institutional protocols and algorithms that pertain to the evaluation of patients at risk for COVID-19 are in a state of rapid change based on information released by regulatory bodies including the CDC and federal and state organizations. These policies and algorithms were followed during the patient's care in the ED.  Final Clinical Impression(s) / ED Diagnoses Final diagnoses:  Swelling of both hands  Localized swelling of both feet  Dizziness    Rx / DC Orders ED Discharge Orders    None       Jean Alejos, Ambrose Finland, MD 02/24/19 1501

## 2019-02-23 NOTE — ED Triage Notes (Signed)
The pt went [facility] this am and had a covid swab but did not get any results  And did n ot gey checked

## 2019-02-23 NOTE — ED Triage Notes (Signed)
The pt is c/o dizziness mausea  And pain in her lt foot and ankle no known in jury no histroy

## 2019-02-23 NOTE — ED Notes (Signed)
Alert and oriented x4.

## 2019-02-27 ENCOUNTER — Ambulatory Visit: Payer: BC Managed Care – PPO | Admitting: Cardiology

## 2019-02-28 ENCOUNTER — Ambulatory Visit: Payer: BC Managed Care – PPO | Admitting: Cardiology

## 2019-03-25 ENCOUNTER — Encounter: Payer: Self-pay | Admitting: Cardiology

## 2019-03-25 ENCOUNTER — Ambulatory Visit: Payer: BC Managed Care – PPO | Admitting: Cardiology

## 2019-03-25 ENCOUNTER — Other Ambulatory Visit: Payer: Self-pay

## 2019-03-25 VITALS — BP 152/70 | HR 67 | Temp 97.6°F | Ht 63.0 in | Wt 151.5 lb

## 2019-03-25 DIAGNOSIS — I10 Essential (primary) hypertension: Secondary | ICD-10-CM | POA: Diagnosis not present

## 2019-03-25 DIAGNOSIS — I6523 Occlusion and stenosis of bilateral carotid arteries: Secondary | ICD-10-CM

## 2019-03-25 DIAGNOSIS — E782 Mixed hyperlipidemia: Secondary | ICD-10-CM | POA: Diagnosis not present

## 2019-03-25 DIAGNOSIS — I25118 Atherosclerotic heart disease of native coronary artery with other forms of angina pectoris: Secondary | ICD-10-CM | POA: Diagnosis not present

## 2019-03-25 DIAGNOSIS — R739 Hyperglycemia, unspecified: Secondary | ICD-10-CM

## 2019-03-25 NOTE — Progress Notes (Signed)
Primary Physician/Referring:  Marden Noble, MD  Patient ID: Tiffany Ryan, female    DOB: 11-15-1955, 64 y.o.   MRN: [id number]  Chief Complaint  Patient presents with  . Hypertension  . Hyperlipidemia  . Coronary Artery Disease  . Results    Labs   HPI:    Tiffany Ryan  is a 64 y.o. Hypertension, hyperglycemia,family history of premature coronary artery disease, Hyperlipidemia, CAD S/P 3.0 x 18 mm Cypher drug-eluting stent to mid RCA placed on 05/16/2003. Coronary angiography on 06/07/2018, Severe heavy calcification was evident in all 3 coronary vessels with patent RCA stent and has a 70% To 75% mid to distal LAD stenosis, Probably physiologically significant but was recommended medical therapy in view of diffuse calcification and uncontrolled hyperlipidemia. She has uncontrolled hyperlipidemia, hypertension and multiple medication intolerances.    Since being on Bystolic, BP is better controlled. Has not had to use sublingual nitroglycerin. Tested positive for Covid-19 in Jan 2021. Taking medications as she feels fit and not following medical advice regarding dosing of the medications.   Past Medical History:  Diagnosis Date  . CAD (coronary artery disease), native coronary artery 01/20/2019  . COVID-19   . High cholesterol   . Hyperlipemia 08/06/2018  . Hypertension   . Unstable angina (HCC) 03/06/2016   Past Surgical History:  Procedure Laterality Date  . ABDOMINAL AORTOGRAM N/A 03/07/2016   Procedure: Abdominal Aortogram;  Surgeon: Yates Decamp, MD;  Location: [facility] INVASIVE CV LAB;  Service: Cardiovascular;  Laterality: N/A;  . CARDIAC SURGERY     stent x 2  . COLPOSCOPY  04/2018  . CORONARY ANGIOGRAPHY N/A 03/07/2016   Procedure: Coronary Angiography and possible PCI;  Surgeon: Yates Decamp, MD;  Location: [facility] INVASIVE CV LAB;  Service: Cardiovascular;  Laterality: N/A;  07:30 Case please  . LEFT HEART CATH AND CORONARY ANGIOGRAPHY N/A 06/07/2018   Procedure: LEFT HEART CATH AND CORONARY  ANGIOGRAPHY;  Surgeon: Yates Decamp, MD;  Location: [facility] INVASIVE CV LAB;  Service: Cardiovascular;  Laterality: N/A;   Family History  Problem Relation Age of Onset  . Stroke Father     Social History   Tobacco Use  . Smoking status: Former Smoker    Packs/day: 1.00    Years: 25.00    Pack years: 25.00    Types: Cigarettes    Quit date: 2006    Years since quitting: 15.1  . Smokeless tobacco: Never Used  Substance Use Topics  . Alcohol use: Yes    Alcohol/week: 1.0 standard drinks    Types: 1 Glasses of wine per week    Comment: OCCASSIONAL   ROS  Review of Systems  Constitution: Positive for malaise/fatigue.  Cardiovascular: Negative for chest pain, dyspnea on exertion and leg swelling.  Musculoskeletal: Positive for arthritis.  Gastrointestinal: Negative for melena.  Psychiatric/Behavioral: Positive for depression. The patient is nervous/anxious.    Objective  Blood pressure (!) 152/70, pulse 67, temperature 97.6 F (36.4 C), height 5\' 3"  (1.6 m), weight 151 lb 8 oz (68.7 kg), SpO2 95 %.  Vitals with BMI 03/25/2019 02/23/2019 02/23/2019  Height 5\' 3"  - 5\' 2"   Weight 151 lbs 8 oz - 151 lbs 14 oz  BMI 26.84 - 27.78  Systolic 152 124 -  Diastolic 70 70 -  Pulse 67 69 -     Physical Exam  Constitutional: Vital signs are normal. She appears well-developed and well-nourished.  Cardiovascular: Normal rate, regular rhythm, normal heart sounds and intact distal pulses.  Pulses:  Carotid pulses are 2+ on the right side with bruit and 2+ on the left side.      Femoral pulses are 2+ on the right side and 2+ on the left side.      Popliteal pulses are 2+ on the right side and 2+ on the left side.       Dorsalis pedis pulses are 1+ on the right side and 1+ on the left side.       Posterior tibial pulses are 0 on the right side and 0 on the left side.  Pulmonary/Chest: Effort normal and breath sounds normal. No accessory muscle usage. No respiratory distress.  Abdominal: Soft.  Bowel sounds are normal.  Vitals reviewed.  Laboratory examination:   Recent Labs    06/06/18 1707 08/10/18 0815 02/23/19 1612  NA 141 142 139  K 4.4 5.1 4.7  CL 105 104 100  CO2 25 25 28   GLUCOSE 135* 117* 143*  BUN 10 10 15   CREATININE 0.69 0.85 0.83  CALCIUM 9.6 9.8 9.2  GFRNONAA >60 74 >60  GFRAA >60 85 >60   CrCl cannot be calculated (Patient's most recent lab result is older than the maximum 21 days allowed.).  CMP Latest Ref Rng & Units 02/23/2019 08/10/2018 06/06/2018  Glucose 70 - 99 mg/dL 08/12/2018) 06/08/2018) 433(I)  BUN 8 - 23 mg/dL 15 10 10   Creatinine 0.44 - 1.00 mg/dL 951(O 841(Y  Sodium 135 - 145 mmol/L 139 142 141  Potassium 3.5 - 5.1 mmol/L 4.7 5.1 4.4  Chloride 98 - 111 mmol/L 100 104 105  CO2 22 - 32 mmol/L 28 25 25   Calcium 8.9 - 10.3 mg/dL 9.2 9.8 9.6  Total Protein 6.5 - 8.1 g/dL 6.7 6.8 -  Total Bilirubin 0.3 - 1.2 mg/dL 0.4 0.3 -  Alkaline Phos 38 - 126 U/L 47 61 -  AST 15 - 41 U/L 23 19 -  ALT 0 - 44 U/L 28 19 -   CBC Latest Ref Rng & Units 02/23/2019 06/07/2018 06/06/2018  WBC 4.0 - 10.5 K/uL 5.3 7.0 6.9  Hemoglobin 12.0 - 15.0 g/dL 02/25/2019 06/09/2018  Hematocrit 36.0 - 46.0 % 43.9 37.5 41.4  Platelets 150 - 400 K/uL 290 299 359   Lipid Panel     Component Value Date/Time   CHOL 158 01/16/2019 0819   TRIG 89 01/16/2019 0819   HDL 48 01/16/2019 0819   CHOLHDL 5.3 06/06/2018 1707   VLDL 31 06/06/2018 1707   LDLCALC 93 01/16/2019 0819   LDLDIRECT 90 01/16/2019 0819   HEMOGLOBIN A1C Lab Results  Component Value Date   HGBA1C 6.2 (H) 06/06/2018   MPG 131.24 06/06/2018   TSH Recent Labs    06/06/18 1707  TSH 2.064   Medications and allergies   Allergies  Allergen Reactions  . Ramipril Cough  . Amlodipine Other (See Comments)    Headache  . Repatha [Evolocumab] Other (See Comments)    Neck tightness     Current Outpatient Medications  Medication Instructions  . aspirin 81 mg, Oral, Daily  . Crestor 10 mg, Oral, Daily, Patient  reduced due to myalgia  . ezetimibe (ZETIA) 10 mg, Oral, Daily  . ibuprofen (ADVIL) 200 mg, Oral, Every 6 hours PRN  . magnesium 30 mg, Oral, Weekly  . nebivolol (BYSTOLIC) 10 mg, Oral, Daily  . nitroGLYCERIN (NITROSTAT) 0.4 mg, Sublingual, Every 5 min PRN  . ranolazine (RANEXA) 500 mg, Oral, 2 times daily PRN  . valsartan-hydrochlorothiazide (DIOVAN HCT) 160-12.5  MG tablet 1 tablet, Oral, BH-each morning  . Vitamin D 2,000 Units, Oral, Daily    Radiology:   No results found.  Cardiac Studies:   Stress test 03/08/2015: Patient exercised on Bruce protocol for 9 minutes and 28 seconds and achieved 106% of MPHR. Occasional PVCs during stress and recovery period, underlying LBBB.Marland Kitchen Normal blood pressure response. Poststress LVEF 60%. without additional wall motion abnormality.  Coronary angiogram 06/07/2018: Right dominant circulation, severely diffusely calcified vessels, previously placed stents in the proximal and mid RCA are widely patent with very mild neointimal hyperplasia in the proximal segment.  LAD is diffusely diseased and there is a focal 70 to 75% stenosis which is probably significant by FFR, however the LAD is diffusely diseased.  Lesion does not suggest unstable lesion and is heavily calcified.  Ramus and circumflex are moderately diffusely diseased and calcified without luminal high-grade stenosis.  Normal LV systolic function, normal LVEDP.  (right coronary artery (3.0 x 18 mm Cypher drug-eluting stent on 05/16/2003 patent).  Carotid artery duplex 02/01/2019:  Stenosis in the right internal carotid artery (16-49%).  Stenosis in the left internal carotid artery (minimal). Stenosis in the  left common carotid artery (<50%). Stenosis in the left external carotid  artery (<50%).  Antegrade right vertebral artery flow. Antegrade left vertebral artery flow.  Compared to the study done on 07/14/2018, bilateral ICA stenosis study range decreased on the right from >50% and left  from 16-49% to the present  minimal stenosis. Follow up in one year is appropriate if clinically indicated.  EKG 01/15/2019: Normal sinus rhythm at rate of 65 bpm, left atrial enlargement, left bundle branch block.  Further analysis.  No significant change from 06/06/2018.    Assessment     ICD-10-CM   1. Atherosclerosis of native coronary artery of native heart with stable angina pectoris ([place])  I25.118   2. Mixed hyperlipidemia  E78.2   3. Essential hypertension  I10   4. Asymptomatic bilateral carotid artery stenosis  I65.23     No orders of the defined types were placed in this encounter.   There are no discontinued medications.   Recommendations:   Magaby Rumberger  is a 64 y.o. female  with Hypertension, hyperglycemia,family history of premature coronary artery disease, Hyperlipidemia, CAD S/P 3.0 x 18 mm Cypher drug-eluting stent to mid RCA placed on 05/16/2003. Coronary angiography on 06/07/2018, Severe heavy calcification was evident in all 3 coronary vessels with patent RCA stent and has a 70% To 75% mid to distal LAD stenosis, Probably physiologically significant but was recommended medical therapy in view of diffuse calcification and uncontrolled hyperlipidemia.  She has multiple medication intolerances.  Since being on Bystolic, blood pressure is improved but patient is not taking medications as prescribed.  Also with regard to statins, she has been taking anywhere from 5 mg of Crestor all the way up to 20 mg of Crestor as she deems fit.  She does not want to try Repatha or Praluent for now.  She did tolerate Praluent but not able to tolerate Repatha stating she had neck pain, we could try prior authorization. I reviewed her labs again. I did not make changes to her medications as I am not sure what exactly she is taking. She wants to recheck labs in 3 months see me back at that time.   I discussed her risks including CAD progression, carotid stenosis as well.  Adrian Prows, MD,  [facility] 03/25/2019, 4:17 PM [facility] [place] Office: [phone]

## 2019-04-05 ENCOUNTER — Other Ambulatory Visit: Payer: Self-pay

## 2019-04-05 DIAGNOSIS — I10 Essential (primary) hypertension: Secondary | ICD-10-CM

## 2019-04-05 MED ORDER — NEBIVOLOL HCL 10 MG PO TABS: 10.0000 mg | ORAL_TABLET | Freq: Every day | ORAL | 1 refills | Status: DC

## 2019-05-14 DIAGNOSIS — Z01411 Encounter for gynecological examination (general) (routine) with abnormal findings: Secondary | ICD-10-CM | POA: Diagnosis not present

## 2019-05-31 DIAGNOSIS — H02831 Dermatochalasis of right upper eyelid: Secondary | ICD-10-CM | POA: Diagnosis not present

## 2019-06-13 ENCOUNTER — Other Ambulatory Visit: Payer: Self-pay | Admitting: Internal Medicine

## 2019-06-13 DIAGNOSIS — Z1231 Encounter for screening mammogram for malignant neoplasm of breast: Secondary | ICD-10-CM

## 2019-06-14 DIAGNOSIS — N879 Dysplasia of cervix uteri, unspecified: Secondary | ICD-10-CM | POA: Diagnosis not present

## 2019-06-14 DIAGNOSIS — R87612 Low grade squamous intraepithelial lesion on cytologic smear of cervix (LGSIL): Secondary | ICD-10-CM | POA: Diagnosis not present

## 2019-06-14 DIAGNOSIS — Z9889 Other specified postprocedural states: Secondary | ICD-10-CM | POA: Diagnosis not present

## 2019-06-14 DIAGNOSIS — N3010 Interstitial cystitis (chronic) without hematuria: Secondary | ICD-10-CM | POA: Diagnosis not present

## 2019-06-21 DIAGNOSIS — E782 Mixed hyperlipidemia: Secondary | ICD-10-CM | POA: Diagnosis not present

## 2019-06-21 DIAGNOSIS — R739 Hyperglycemia, unspecified: Secondary | ICD-10-CM | POA: Diagnosis not present

## 2019-06-21 DIAGNOSIS — I10 Essential (primary) hypertension: Secondary | ICD-10-CM | POA: Diagnosis not present

## 2019-06-21 NOTE — Progress Notes (Signed)
Primary Physician/Referring:  Marden Noble, MD  Patient ID: Tiffany Ryan, female    DOB: [birth year], 64 y.o.   MRN: [id number]  Chief Complaint  Patient presents with  . Follow-up    3 month  . Coronary Artery Disease  . Hypertension  . Hyperlipidemia   HPI:    Tiffany Ryan  is a 64 y.o. Hypertension, hyperglycemia,family history of premature coronary artery disease, Hyperlipidemia, CAD S/P 3.0 x 18 mm Cypher drug-eluting stent to mid RCA placed on 05/16/2003. Coronary angiography on 06/07/2018, Severe heavy calcification was evident in all 3 coronary vessels with patent RCA stent and has a 70% To 75% mid to distal LAD stenosis, Probably physiologically significant but was recommended medical therapy in view of diffuse calcification and uncontrolled hyperlipidemia. She has uncontrolled hyperlipidemia, hypertension and multiple medication intolerances.    Since being on Bystolic, BP is better controlled. Has not had to use sublingual nitroglycerin. Tested positive for Covid-19 in Jan 2021.  Since then she has noticed severe arthralgias and pain in her legs.  She presents here for a 63-month office visit, states that she has been monitoring her blood pressure very well and has been trying to eat healthy.  Past Medical History:  Diagnosis Date  . CAD (coronary artery disease), native coronary artery 01/20/2019  . COVID-19   . High cholesterol   . Hyperlipemia 08/06/2018  . Hypertension   . Unstable angina (HCC) 03/06/2016   Past Surgical History:  Procedure Laterality Date  . ABDOMINAL AORTOGRAM N/A 03/07/2016   Procedure: Abdominal Aortogram;  Surgeon: Yates Decamp, MD;  Location: [facility] INVASIVE CV LAB;  Service: Cardiovascular;  Laterality: N/A;  . CARDIAC SURGERY     stent x 2  . COLPOSCOPY  04/2018  . CORONARY ANGIOGRAPHY N/A 03/07/2016   Procedure: Coronary Angiography and possible PCI;  Surgeon: Yates Decamp, MD;  Location: [facility] INVASIVE CV LAB;  Service: Cardiovascular;  Laterality: N/A;  07:30  Case please  . LEFT HEART CATH AND CORONARY ANGIOGRAPHY N/A 06/07/2018   Procedure: LEFT HEART CATH AND CORONARY ANGIOGRAPHY;  Surgeon: Yates Decamp, MD;  Location: [facility] INVASIVE CV LAB;  Service: Cardiovascular;  Laterality: N/A;   Family History  Problem Relation Age of Onset  . Stroke Father     Social History   Tobacco Use  . Smoking status: Former Smoker    Packs/day: 1.00    Years: 25.00    Pack years: 25.00    Types: Cigarettes    Quit date: 2006    Years since quitting: 15.4  . Smokeless tobacco: Never Used  Substance Use Topics  . Alcohol use: Yes    Alcohol/week: 1.0 standard drinks    Types: 1 Glasses of wine per week    Comment: OCCASSIONAL   Marital Status: Married  ROS  Review of Systems  Constitution: Positive for malaise/fatigue.  Cardiovascular: Negative for chest pain, dyspnea on exertion and leg swelling.  Musculoskeletal: Positive for arthritis.  Gastrointestinal: Negative for melena.  Psychiatric/Behavioral: Positive for depression. The patient is nervous/anxious.    Objective  Blood pressure (!) 172/75, pulse 74, resp. rate 16, height 5\' 3"  (1.6 m), weight 154 lb (69.9 kg), SpO2 97 %.  Vitals with BMI 06/26/2019 06/26/2019 03/25/2019  Height - 5\' 3"  5\' 3"   Weight - 154 lbs 151 lbs 8 oz  BMI - 27.29 26.84  Systolic 172 179 05/25/2019  Diastolic 75 85 70  Pulse 74 75 67     Physical Exam  Constitutional: Vital signs are  normal. She appears well-developed and well-nourished.  Cardiovascular: Normal rate, regular rhythm, normal heart sounds and intact distal pulses.  Pulses:      Carotid pulses are 2+ on the right side with bruit and 2+ on the left side.      Femoral pulses are 2+ on the right side and 2+ on the left side.      Popliteal pulses are 2+ on the right side and 2+ on the left side.       Dorsalis pedis pulses are 1+ on the right side and 1+ on the left side.       Posterior tibial pulses are 0 on the right side and 0 on the left side.   Pulmonary/Chest: Effort normal and breath sounds normal. No accessory muscle usage. No respiratory distress.  Abdominal: Soft. Bowel sounds are normal.  Vitals reviewed.  Laboratory examination:   Recent Labs    08/10/18 0815 02/23/19 1612 06/21/19 0814  NA 142 139 142  K 5.1 4.7 5.1  CL 104 100 106  CO2 25 28 23   GLUCOSE 117* 143* 107*  BUN 10 15 12   CREATININE 0.85 0.83 0.84  CALCIUM 9.8 9.2 9.7  GFRNONAA 74 >60 74  GFRAA 85 >60 86   estimated creatinine clearance is 64.3 mL/min (by C-G formula based on SCr of 0.84 mg/dL).  CMP Latest Ref Rng & Units 06/21/2019 02/23/2019 08/10/2018  Glucose 65 - 99 mg/dL 02/25/2019) 08/12/2018) 757(V)  BUN 8 - 27 mg/dL 12 15 10   Creatinine 0.57 - 1.00 mg/dL 728(A 060(R  Sodium 134 - 144 mmol/L 142 139 142  Potassium 3.5 - 5.2 mmol/L 5.1 4.7 5.1  Chloride 96 - 106 mmol/L 106 100 104  CO2 20 - 29 mmol/L 23 28 25   Calcium 8.7 - 10.3 mg/dL 9.7 9.2 9.8  Total Protein 6.0 - 8.5 g/dL 7.0 6.7 6.8  Total Bilirubin 0.0 - 1.2 mg/dL 0.3 0.4 0.3  Alkaline Phos 48 - 121 IU/L 65 47 61  AST 0 - 40 IU/L 20 23 19   ALT 0 - 32 IU/L 13 28 19    CBC Latest Ref Rng & Units 02/23/2019 06/07/2018 06/06/2018  WBC 4.0 - 10.5 K/uL 5.3 7.0 6.9  Hemoglobin 12.0 - 15.0 g/dL    Hematocrit 36.0 - 46.0 % 43.9 37.5 41.4  Platelets 150 - 400 K/uL 290 299 359   Lipid Panel     Component Value Date/Time   CHOL 161 06/21/2019 0816   TRIG 87 06/21/2019 0816   HDL 47 06/21/2019 0816   CHOLHDL 5.3 06/06/2018 1707   VLDL 31 06/06/2018 1707   LDLCALC 98 06/21/2019 0816   LDLDIRECT 90 01/16/2019 0819   HEMOGLOBIN A1C Lab Results  Component Value Date   HGBA1C 5.8 (H) 06/21/2019   MPG 131.24 06/06/2018   TSH No results for input(s): TSH in the last 8760 hours.   External Labs: TSH 2.150 10/31/2018  Medications and allergies   Allergies  Allergen Reactions  . Ramipril Cough  . Amlodipine Other (See Comments)    Headache  . Repatha [Evolocumab] Other  (See Comments)    Neck tightness     Radiology:   No results found.  Cardiac Studies:   Stress test 03/08/2015: Patient exercised on Bruce protocol for 9 minutes and 28 seconds and achieved 106% of MPHR. Occasional PVCs during stress and recovery period, underlying LBBB.06/23/2019 Normal blood pressure response. Poststress LVEF 60%. without additional wall motion abnormality.  Coronary angiogram 06/07/2018: Right dominant  circulation, severely diffusely calcified vessels, previously placed stents in the proximal and mid RCA are widely patent with very mild neointimal hyperplasia in the proximal segment.  LAD is diffusely diseased and there is a focal 70 to 75% stenosis which is probably significant by FFR, however the LAD is diffusely diseased.  Lesion does not suggest unstable lesion and is heavily calcified.  Ramus and circumflex are moderately diffusely diseased and calcified without luminal high-grade stenosis.  Normal LV systolic function, normal LVEDP.  (right coronary artery (3.0 x 18 mm Cypher drug-eluting stent on 05/16/2003 patent).  Carotid artery duplex 02/01/2019:  Stenosis in the right internal carotid artery (16-49%).  Stenosis in the left internal carotid artery (minimal). Stenosis in the  left common carotid artery (<50%). Stenosis in the left external carotid  artery (<50%).  Antegrade right vertebral artery flow. Antegrade left vertebral artery flow.  Compared to the study done on 07/14/2018, bilateral ICA stenosis study range decreased on the right from >50% and left from 16-49% to the present  minimal stenosis. Follow up in one year is appropriate if clinically indicated.  EKG   EKG 06/26/2019: Normal sinus rhythm with rate of 67 bpm, left atrial enlargement, left bundle branch block.  No further analysis.  No significant change from 01/15/2019.   Assessment     ICD-10-CM   1. Essential hypertension  I10 EKG 12-Lead  2. Atherosclerosis of native coronary artery of native  heart with stable angina pectoris (HCC)  I25.118   3. Hypercholesteremia  E78.00      Outpatient Encounter Medications as of 06/26/2019  Medication Sig  . aspirin 81 MG tablet Take 81 mg by mouth daily.  . Cholecalciferol (VITAMIN D) 2000 units CAPS Take 2,000 Units by mouth daily.  . CRESTOR 20 MG tablet Take 0.5 tablets (10 mg total) by mouth daily. Patient reduced due to myalgia  . ezetimibe (ZETIA) 10 MG tablet Take 1 tablet (10 mg total) by mouth daily.  Marland Kitchen ibuprofen (ADVIL,MOTRIN) 200 MG tablet Take 200 mg by mouth every 6 (six) hours as needed for moderate pain.  . magnesium 30 MG tablet Take 30 mg by mouth once a week.  . nitroGLYCERIN (NITROSTAT) 0.4 MG SL tablet Place 0.4 mg under the tongue every 5 (five) minutes as needed for chest pain.  . ranolazine (RANEXA) 500 MG 12 hr tablet Take 1 tablet (500 mg total) by mouth 2 (two) times daily as needed.  . nebivolol (BYSTOLIC) 10 MG tablet Take 1 tablet (10 mg total) by mouth daily. (Patient not taking: Reported on 06/26/2019)  . valsartan-hydrochlorothiazide (DIOVAN HCT) 160-12.5 MG tablet Take 1 tablet by mouth every morning. (Patient not taking: Reported on 06/26/2019)   No facility-administered encounter medications on file as of 06/26/2019.    Recommendations:   Tiffany Ryan  is a 64 y.o. female  with Hypertension, hyperglycemia,family history of premature coronary artery disease, Hyperlipidemia, CAD S/P 3.0 x 18 mm Cypher drug-eluting stent to mid RCA placed on 05/16/2003. Coronary angiography on 06/07/2018, Severe heavy calcification was evident in all 3 coronary vessels with patent RCA stent and has a 70% To 75% mid to distal LAD stenosis, Probably physiologically significant but was recommended medical therapy in view of diffuse calcification and uncontrolled hyperlipidemia.  She has multiple medication intolerances.  Patient's blood pressure has been well controlled at home, she was very upset in the office as her blood pressure was high  but elsewhere blood pressure including physician offices has been well controlled.  I  reassured her that as long as it is being monitored closely I will accept good control of blood pressure to be less than 130/80 mmHg.  Hence I did not make any changes to her medications today.  She has not had any recurrence of angina pectoris and no symptoms to suggest TIA.  She is now tolerating Crestor and Zetia and I have achieved LDL goal less than 100, but prefer to be less than 70.  But in view of intolerances to medications including PCSK9 inhibitors, advised her to continue using flaxseed flakes, dietary changes and continue present medications.  She could also add red yeast rice but not to stop statins if she develops myalgias.  I will see her back in 6 months.   Adrian Prows, MD, [facility] 06/26/2019, 4:23 PM [facility] [place] Pager: [phone] Office: [phone]

## 2019-06-22 LAB — CMP14+EGFR
ALT: 13 IU/L (ref 0–32)
AST: 20 IU/L (ref 0–40)
Albumin/Globulin Ratio: 1.8 (ref 1.2–2.2)
Albumin: 4.5 g/dL (ref 3.8–4.8)
Alkaline Phosphatase: 65 IU/L (ref 48–121)
BUN/Creatinine Ratio: 14 (ref 12–28)
BUN: 12 mg/dL (ref 8–27)
Bilirubin Total: 0.3 mg/dL (ref 0.0–1.2)
CO2: 23 mmol/L (ref 20–29)
Calcium: 9.7 mg/dL (ref 8.7–10.3)
Chloride: 106 mmol/L (ref 96–106)
Creatinine, Ser: 0.84 mg/dL (ref 0.57–1.00)
GFR calc Af Amer: 86 mL/min/{1.73_m2} (ref >59)
GFR calc non Af Amer: 74 mL/min/{1.73_m2} (ref >59)
Globulin, Total: 2.5 g/dL (ref 1.5–4.5)
Glucose: 107 mg/dL — ABNORMAL HIGH (ref 65–99)
Potassium: 5.1 mmol/L (ref 3.5–5.2)
Sodium: 142 mmol/L (ref 134–144)
Total Protein: 7 g/dL (ref 6.0–8.5)

## 2019-06-22 LAB — LIPID PANEL WITH LDL/HDL RATIO
Cholesterol, Total: 161 mg/dL (ref 100–199)
HDL: 47 mg/dL (ref >39)
LDL Chol Calc (NIH): 98 mg/dL (ref 0–99)
LDL/HDL Ratio: 2.1 ratio (ref 0.0–3.2)
Triglycerides: 87 mg/dL (ref 0–149)
VLDL Cholesterol Cal: 16 mg/dL (ref 5–40)

## 2019-06-22 LAB — HGB A1C W/O EAG: Hgb A1c MFr Bld: 5.8 % — ABNORMAL HIGH (ref 4.8–5.6)

## 2019-06-22 NOTE — Progress Notes (Signed)
Normal lipids and labs. Blood sugar improved, and normal renal and hepatic function. Prefer LDL to be <70, however patient is reluctant to changing therapy

## 2019-06-26 ENCOUNTER — Encounter: Payer: Self-pay | Admitting: Cardiology

## 2019-06-26 ENCOUNTER — Other Ambulatory Visit: Payer: Self-pay

## 2019-06-26 ENCOUNTER — Ambulatory Visit: Payer: BC Managed Care – PPO | Admitting: Cardiology

## 2019-06-26 VITALS — BP 172/75 | HR 74 | Resp 16 | Ht 63.0 in | Wt 154.0 lb

## 2019-06-26 DIAGNOSIS — E7800 Pure hypercholesterolemia, unspecified: Secondary | ICD-10-CM

## 2019-06-26 DIAGNOSIS — I25118 Atherosclerotic heart disease of native coronary artery with other forms of angina pectoris: Secondary | ICD-10-CM | POA: Diagnosis not present

## 2019-06-26 DIAGNOSIS — I10 Essential (primary) hypertension: Secondary | ICD-10-CM

## 2019-06-28 ENCOUNTER — Other Ambulatory Visit: Payer: Self-pay | Admitting: Internal Medicine

## 2019-06-28 ENCOUNTER — Ambulatory Visit
Admission: RE | Admit: 2019-06-28 | Discharge: 2019-06-28 | Disposition: A | Discharge status: Home / Self Care | Payer: BC Managed Care – PPO | Source: Ambulatory Visit | Attending: Internal Medicine | Admitting: Internal Medicine

## 2019-06-28 DIAGNOSIS — I2510 Atherosclerotic heart disease of native coronary artery without angina pectoris: Secondary | ICD-10-CM | POA: Diagnosis not present

## 2019-06-28 DIAGNOSIS — M25561 Pain in right knee: Secondary | ICD-10-CM

## 2019-06-28 DIAGNOSIS — I10 Essential (primary) hypertension: Secondary | ICD-10-CM | POA: Diagnosis not present

## 2019-07-03 ENCOUNTER — Other Ambulatory Visit: Payer: Self-pay | Admitting: Obstetrics & Gynecology

## 2019-07-03 DIAGNOSIS — N870 Mild cervical dysplasia: Secondary | ICD-10-CM | POA: Diagnosis not present

## 2019-07-30 ENCOUNTER — Other Ambulatory Visit: Payer: Self-pay | Admitting: Cardiology

## 2019-07-30 DIAGNOSIS — I25118 Atherosclerotic heart disease of native coronary artery with other forms of angina pectoris: Secondary | ICD-10-CM

## 2019-08-06 DIAGNOSIS — Z1152 Encounter for screening for COVID-19: Secondary | ICD-10-CM | POA: Diagnosis not present

## 2019-09-06 DIAGNOSIS — M25562 Pain in left knee: Secondary | ICD-10-CM | POA: Diagnosis not present

## 2019-09-06 DIAGNOSIS — M25561 Pain in right knee: Secondary | ICD-10-CM | POA: Diagnosis not present

## 2019-09-06 DIAGNOSIS — M1711 Unilateral primary osteoarthritis, right knee: Secondary | ICD-10-CM | POA: Diagnosis not present

## 2019-09-06 DIAGNOSIS — M2241 Chondromalacia patellae, right knee: Secondary | ICD-10-CM | POA: Diagnosis not present

## 2019-09-27 ENCOUNTER — Other Ambulatory Visit: Payer: Self-pay

## 2019-09-27 MED ORDER — VALSARTAN 80 MG PO TABS: 80.0000 mg | ORAL_TABLET | Freq: Every day | ORAL | 1 refills | Status: DC

## 2019-09-27 NOTE — Telephone Encounter (Signed)
DONE

## 2019-10-14 ENCOUNTER — Telehealth: Payer: Self-pay

## 2019-10-14 NOTE — Telephone Encounter (Signed)
Patient called that she is having SOB and chest pressure she is on valsartan patient doesn't know if these symptoms are related to medication please advise

## 2019-10-14 NOTE — Telephone Encounter (Signed)
She can stop it.

## 2019-10-15 NOTE — Telephone Encounter (Signed)
I can Rx Spironolactone 50 mg daily and she can discontinue valsartan

## 2019-10-15 NOTE — Telephone Encounter (Signed)
Spoke to patient she states she will discontinue valsartan but is concerned about her bp due to not taking nothing for her high blood pressure please advise

## 2019-10-18 ENCOUNTER — Other Ambulatory Visit: Payer: Self-pay | Admitting: Cardiology

## 2019-10-18 DIAGNOSIS — I10 Essential (primary) hypertension: Secondary | ICD-10-CM

## 2019-10-18 MED ORDER — CARVEDILOL 6.25 MG PO TABS: 6.2500 mg | ORAL_TABLET | Freq: Two times a day (BID) | ORAL | 1 refills | Status: DC

## 2019-11-11 DIAGNOSIS — F4321 Adjustment disorder with depressed mood: Secondary | ICD-10-CM | POA: Diagnosis not present

## 2019-11-11 DIAGNOSIS — E559 Vitamin D deficiency, unspecified: Secondary | ICD-10-CM | POA: Diagnosis not present

## 2019-11-11 DIAGNOSIS — Z0000 Encounter for general adult medical examination without abnormal findings: Secondary | ICD-10-CM | POA: Diagnosis not present

## 2019-11-11 DIAGNOSIS — Z79899 Other long term (current) drug therapy: Secondary | ICD-10-CM | POA: Diagnosis not present

## 2019-11-11 DIAGNOSIS — R1030 Lower abdominal pain, unspecified: Secondary | ICD-10-CM | POA: Diagnosis not present

## 2019-11-11 DIAGNOSIS — R739 Hyperglycemia, unspecified: Secondary | ICD-10-CM | POA: Diagnosis not present

## 2019-11-11 DIAGNOSIS — N939 Abnormal uterine and vaginal bleeding, unspecified: Secondary | ICD-10-CM | POA: Diagnosis not present

## 2019-11-11 DIAGNOSIS — E785 Hyperlipidemia, unspecified: Secondary | ICD-10-CM | POA: Diagnosis not present

## 2019-11-11 DIAGNOSIS — I10 Essential (primary) hypertension: Secondary | ICD-10-CM | POA: Diagnosis not present

## 2019-11-11 DIAGNOSIS — N390 Urinary tract infection, site not specified: Secondary | ICD-10-CM | POA: Diagnosis not present

## 2019-11-18 DIAGNOSIS — L929 Granulomatous disorder of the skin and subcutaneous tissue, unspecified: Secondary | ICD-10-CM | POA: Diagnosis not present

## 2019-11-18 DIAGNOSIS — R102 Pelvic and perineal pain: Secondary | ICD-10-CM | POA: Diagnosis not present

## 2019-11-18 DIAGNOSIS — N952 Postmenopausal atrophic vaginitis: Secondary | ICD-10-CM | POA: Diagnosis not present

## 2019-11-18 DIAGNOSIS — N898 Other specified noninflammatory disorders of vagina: Secondary | ICD-10-CM | POA: Diagnosis not present

## 2019-11-18 DIAGNOSIS — N930 Postcoital and contact bleeding: Secondary | ICD-10-CM | POA: Diagnosis not present

## 2019-12-10 DIAGNOSIS — R102 Pelvic and perineal pain: Secondary | ICD-10-CM | POA: Diagnosis not present

## 2019-12-10 DIAGNOSIS — N879 Dysplasia of cervix uteri, unspecified: Secondary | ICD-10-CM | POA: Diagnosis not present

## 2019-12-10 DIAGNOSIS — N930 Postcoital and contact bleeding: Secondary | ICD-10-CM | POA: Diagnosis not present

## 2019-12-10 DIAGNOSIS — N952 Postmenopausal atrophic vaginitis: Secondary | ICD-10-CM | POA: Diagnosis not present

## 2019-12-17 DIAGNOSIS — R8281 Pyuria: Secondary | ICD-10-CM | POA: Diagnosis not present

## 2019-12-17 DIAGNOSIS — R739 Hyperglycemia, unspecified: Secondary | ICD-10-CM | POA: Diagnosis not present

## 2019-12-17 DIAGNOSIS — N879 Dysplasia of cervix uteri, unspecified: Secondary | ICD-10-CM | POA: Diagnosis not present

## 2019-12-17 DIAGNOSIS — N952 Postmenopausal atrophic vaginitis: Secondary | ICD-10-CM | POA: Diagnosis not present

## 2019-12-17 DIAGNOSIS — R102 Pelvic and perineal pain: Secondary | ICD-10-CM | POA: Diagnosis not present

## 2019-12-24 ENCOUNTER — Other Ambulatory Visit: Payer: Self-pay

## 2019-12-24 MED ORDER — CRESTOR 20 MG PO TABS: 10.0000 mg | ORAL_TABLET | Freq: Every day | ORAL | 3 refills | Status: DC

## 2019-12-26 ENCOUNTER — Ambulatory Visit: Payer: BC Managed Care – PPO | Admitting: Cardiology

## 2020-03-23 DIAGNOSIS — M1711 Unilateral primary osteoarthritis, right knee: Secondary | ICD-10-CM | POA: Diagnosis not present

## 2020-05-15 ENCOUNTER — Telehealth: Payer: Self-pay

## 2020-05-15 NOTE — Telephone Encounter (Signed)
Yes she said" ok thanks"

## 2020-05-15 NOTE — Telephone Encounter (Signed)
Did this patient agree to go to ED?

## 2020-05-19 ENCOUNTER — Other Ambulatory Visit: Payer: Self-pay | Admitting: Cardiology

## 2020-05-19 DIAGNOSIS — I25118 Atherosclerotic heart disease of native coronary artery with other forms of angina pectoris: Secondary | ICD-10-CM

## 2020-06-17 ENCOUNTER — Telehealth: Payer: Self-pay | Admitting: Cardiology

## 2020-06-17 NOTE — Telephone Encounter (Signed)
Unless PCP has not done routine labs including lipids then I will place the orders.  If PCP is done the labs, she can obtain them and bring them with her otherwise please asked the front desk to get me the labs.

## 2020-06-19 ENCOUNTER — Other Ambulatory Visit: Payer: Self-pay

## 2020-06-19 DIAGNOSIS — I10 Essential (primary) hypertension: Secondary | ICD-10-CM

## 2020-06-19 DIAGNOSIS — I25118 Atherosclerotic heart disease of native coronary artery with other forms of angina pectoris: Secondary | ICD-10-CM

## 2020-06-20 LAB — LIPID PANEL WITH LDL/HDL RATIO
Cholesterol, Total: 165 mg/dL (ref 100–199)
HDL: 49 mg/dL (ref >39)
LDL Chol Calc (NIH): 100 mg/dL — ABNORMAL HIGH (ref 0–99)
LDL/HDL Ratio: 2 ratio (ref 0.0–3.2)
Triglycerides: 86 mg/dL (ref 0–149)
VLDL Cholesterol Cal: 16 mg/dL (ref 5–40)

## 2020-06-20 LAB — CMP14+EGFR
ALT: 13 IU/L (ref 0–32)
AST: 13 IU/L (ref 0–40)
Albumin/Globulin Ratio: 2 (ref 1.2–2.2)
Albumin: 4.6 g/dL (ref 3.8–4.8)
Alkaline Phosphatase: 65 IU/L (ref 44–121)
BUN/Creatinine Ratio: 15 (ref 12–28)
BUN: 13 mg/dL (ref 8–27)
Bilirubin Total: 0.4 mg/dL (ref 0.0–1.2)
CO2: 26 mmol/L (ref 20–29)
Calcium: 9.8 mg/dL (ref 8.7–10.3)
Chloride: 103 mmol/L (ref 96–106)
Creatinine, Ser: 0.85 mg/dL (ref 0.57–1.00)
Globulin, Total: 2.3 g/dL (ref 1.5–4.5)
Glucose: 111 mg/dL — ABNORMAL HIGH (ref 65–99)
Potassium: 4.8 mmol/L (ref 3.5–5.2)
Sodium: 141 mmol/L (ref 134–144)
Total Protein: 6.9 g/dL (ref 6.0–8.5)
eGFR: 76 mL/min/{1.73_m2} (ref >59)

## 2020-06-20 LAB — HEMOGLOBIN A1C
Est. average glucose Bld gHb Est-mCnc: 128 mg/dL
Hgb A1c MFr Bld: 6.1 % — ABNORMAL HIGH (ref 4.8–5.6)

## 2020-06-20 NOTE — Progress Notes (Signed)
Stable labs from previous. Patient does not want to escalate LDL reduction

## 2020-06-24 ENCOUNTER — Ambulatory Visit: Payer: BC Managed Care – PPO | Admitting: Cardiology

## 2020-06-24 ENCOUNTER — Encounter: Payer: Self-pay | Admitting: Cardiology

## 2020-06-24 ENCOUNTER — Other Ambulatory Visit: Payer: Self-pay

## 2020-06-24 VITALS — BP 140/86 | HR 81 | Temp 98.6°F | Resp 16 | Ht 63.0 in | Wt 155.0 lb

## 2020-06-24 DIAGNOSIS — I10 Essential (primary) hypertension: Secondary | ICD-10-CM

## 2020-06-24 DIAGNOSIS — I25118 Atherosclerotic heart disease of native coronary artery with other forms of angina pectoris: Secondary | ICD-10-CM | POA: Diagnosis not present

## 2020-06-24 DIAGNOSIS — E7800 Pure hypercholesterolemia, unspecified: Secondary | ICD-10-CM | POA: Diagnosis not present

## 2020-06-24 DIAGNOSIS — I6523 Occlusion and stenosis of bilateral carotid arteries: Secondary | ICD-10-CM

## 2020-06-24 DIAGNOSIS — F411 Generalized anxiety disorder: Secondary | ICD-10-CM

## 2020-06-24 MED ORDER — ALPRAZOLAM 0.25 MG PO TABS: 0.2500 mg | ORAL_TABLET | Freq: Every day | ORAL | 5 refills | Status: DC | PRN

## 2020-06-24 NOTE — Progress Notes (Signed)
Primary Physician/Referring:  Marden Noble, MD  Patient ID: Tiffany Ryan, female    DOB: April 24, 1955, 65 y.o.   MRN: [id number]  Chief Complaint  Patient presents with  . Hypertension  . Medication Refill  . Coronary Artery Disease  . Hyperlipidemia   HPI:    Tiffany Ryan  is a 65 y.o. Caucasian female with hypertension, hyperglycemia,family history of premature coronary artery disease, multiple medication intolerances, hyperlipidemia, CAD S/P 3.0 x 18 mm Cypher drug-eluting stent to mid RCA placed on 05/16/2003. Coronary angiography on 06/07/2018, Severe heavy calcification was evident in all 3 coronary vessels with patent RCA stent and has a 70% To 75% mid to distal LAD stenosis, probably physiologically significant but was recommended medical therapy in view of diffuse calcification and uncontrolled hyperlipidemia.  This is 32-month office visit, states his symptoms of angina has remained stable.  Brings home blood pressure recordings which is under excellent control.  She has not had to use sublingual nitroglycerin. Tested positive for Covid-19 in Jan 2021.   She has also been compliant with taking Crestor on a regular basis along with Zetia although it causes her to have abdominal discomfort she discontinues Zetia for about 2 to 3 days and restarts the medication.  Past Medical History:  Diagnosis Date  . CAD (coronary artery disease), native coronary artery 01/20/2019  . COVID-19   . High cholesterol   . Hyperlipemia 08/06/2018  . Hypertension   . Unstable angina (HCC) 03/06/2016   Past Surgical History:  Procedure Laterality Date  . ABDOMINAL AORTOGRAM N/A 03/07/2016   Procedure: Abdominal Aortogram;  Surgeon: Yates Decamp, MD;  Location: [facility] INVASIVE CV LAB;  Service: Cardiovascular;  Laterality: N/A;  . CARDIAC SURGERY     stent x 2  . COLPOSCOPY  04/2018  . CORONARY ANGIOGRAPHY N/A 03/07/2016   Procedure: Coronary Angiography and possible PCI;  Surgeon: Yates Decamp, MD;  Location: [facility]  INVASIVE CV LAB;  Service: Cardiovascular;  Laterality: N/A;  07:30 Case please  . LEFT HEART CATH AND CORONARY ANGIOGRAPHY N/A 06/07/2018   Procedure: LEFT HEART CATH AND CORONARY ANGIOGRAPHY;  Surgeon: Yates Decamp, MD;  Location: [facility] INVASIVE CV LAB;  Service: Cardiovascular;  Laterality: N/A;   Family History  Problem Relation Age of Onset  . Stroke Father     Social History   Tobacco Use  . Smoking status: Former Smoker    Packs/day: 1.00    Years: 25.00    Pack years: 25.00    Types: Cigarettes    Quit date: 2006    Years since quitting: 16.4  . Smokeless tobacco: Never Used  Substance Use Topics  . Alcohol use: Yes    Alcohol/week: 1.0 standard drink    Types: 1 Glasses of wine per week    Comment: OCCASSIONAL   Marital Status: Married  ROS  Review of Systems  Constitutional: Positive for malaise/fatigue.  Cardiovascular: Negative for chest pain, dyspnea on exertion and leg swelling.  Musculoskeletal: Positive for arthritis.  Gastrointestinal: Negative for melena.  Psychiatric/Behavioral: Positive for depression. The patient is nervous/anxious.    Objective  Blood pressure 140/86, pulse 81, temperature 98.6 F (37 C), temperature source Temporal, resp. rate 16, height 5\' 3"  (1.6 m), weight 155 lb (70.3 kg), SpO2 97 %.  Vitals with BMI 06/24/2020 06/26/2019 06/26/2019  Height 5\' 3"  - 5\' 3"   Weight 155 lbs - 154 lbs  BMI 27.46 - 27.29  Systolic 140 172 08/26/2019  Diastolic 86 75 85  Pulse 81 74  75     Physical Exam Vitals reviewed.  Constitutional:      Appearance: She is well-developed.  Neck:     Vascular: Carotid bruit (Bilateral carotid bruit right >left) present. No JVD.  Cardiovascular:     Rate and Rhythm: Normal rate and regular rhythm.     Pulses: Intact distal pulses.          Femoral pulses are 2+ on the right side and 2+ on the left side.      Popliteal pulses are 2+ on the right side and 2+ on the left side.       Dorsalis pedis pulses are 1+ on the right  side and 1+ on the left side.       Posterior tibial pulses are 1+ on the right side and 1+ on the left side.     Heart sounds: Normal heart sounds.  Pulmonary:     Effort: Pulmonary effort is normal. No accessory muscle usage or respiratory distress.     Breath sounds: Normal breath sounds.  Abdominal:     General: Bowel sounds are normal.     Palpations: Abdomen is soft.  Musculoskeletal:        General: No swelling. Normal range of motion.    Laboratory examination:   Recent Labs    06/19/20 0834  NA 141  K 4.8  CL 103  CO2 26  GLUCOSE 111*  BUN 13  CREATININE 0.85  CALCIUM 9.8   estimated creatinine clearance is 62.9 mL/min (by C-G formula based on SCr of 0.85 mg/dL).  CMP Latest Ref Rng & Units 06/19/2020 06/21/2019 02/23/2019  Glucose 65 - 99 mg/dL 400(Q) 676(P) 950(D)  BUN 8 - 27 mg/dL 13 12 15   Creatinine 0.57 - 1.00 mg/dL 3.26 7.12  Sodium 134 - 144 mmol/L 141 142 139  Potassium 3.5 - 5.2 mmol/L 4.8 5.1 4.7  Chloride 96 - 106 mmol/L 103 106 100  CO2 20 - 29 mmol/L 26 23 28   Calcium 8.7 - 10.3 mg/dL 9.8 9.7 9.2  Total Protein 6.0 - 8.5 g/dL 6.9 7.0 6.7  Total Bilirubin 0.0 - 1.2 mg/dL 0.4 0.3 0.4  Alkaline Phos 44 - 121 IU/L 65 65 47  AST 0 - 40 IU/L 13 20 23   ALT 0 - 32 IU/L 13 13 28    CBC Latest Ref Rng & Units 02/23/2019 06/07/2018 06/06/2018  WBC 4.0 - 10.5 K/uL 5.3 7.0 6.9  Hemoglobin 12.0 - 15.0 g/dL 02/25/2019 06/09/2018  Hematocrit 36.0 - 46.0 % 43.9 37.5 41.4  Platelets 150 - 400 K/uL 290 299 359    Lipid Panel Recent Labs    06/19/20 0835  CHOL 165  TRIG 86  LDLCALC 100*  HDL 49    Lipid Panel     Component Value Date/Time   CHOL 165 06/19/2020 0835   TRIG 86 06/19/2020 0835   HDL 49 06/19/2020 0835   CHOLHDL 5.3 06/06/2018 1707   VLDL 31 06/06/2018 1707   LDLCALC 100 (H) 06/19/2020 0835   LDLDIRECT 90 01/16/2019 0819   HEMOGLOBIN A1C Lab Results  Component Value Date   HGBA1C 6.1 (H) 06/19/2020   MPG 131.24 06/06/2018    External  Labs: TSH 2.150 10/31/2018  Medications and allergies   Allergies  Allergen Reactions  . Ramipril Cough  . Amlodipine Other (See Comments)    Headache  . Repatha [Evolocumab] Other (See Comments)    Neck tightness     Radiology:   No  results found.  Cardiac Studies:   Stress test 03/08/2015: Patient exercised on Bruce protocol for 9 minutes and 28 seconds and achieved 106% of MPHR. Occasional PVCs during stress and recovery period, underlying LBBB.Marland Kitchen. Normal blood pressure response. Poststress LVEF 60%. without additional wall motion abnormality.  Coronary angiogram 06/07/2018: Right dominant circulation, severely diffusely calcified vessels, previously placed stents in the proximal and mid RCA are widely patent with very mild neointimal hyperplasia in the proximal segment.  LAD is diffusely diseased and there is a focal 70 to 75% stenosis which is probably significant by FFR, however the LAD is diffusely diseased.  Lesion does not suggest unstable lesion and is heavily calcified.  Ramus and circumflex are moderately diffusely diseased and calcified without luminal high-grade stenosis.  Normal LV systolic function, normal LVEDP.  (right coronary artery (3.0 x 18 mm Cypher drug-eluting stent on 05/16/2003 patent).  Carotid artery duplex 02/01/2019:  Stenosis in the right internal carotid artery (16-49%).  Stenosis in the left internal carotid artery (minimal). Stenosis in the  left common carotid artery (<50%). Stenosis in the left external carotid  artery (<50%).  Antegrade right vertebral artery flow. Antegrade left vertebral artery flow.  Compared to the study done on 07/14/2018, bilateral ICA stenosis study range decreased on the right from >50% and left from 16-49% to the present  minimal stenosis. Follow up in one year is appropriate if clinically indicated.  EKG   EKG 06/26/2019: Normal sinus rhythm with rate of 67 bpm, left atrial enlargement, left bundle branch block.  No  further analysis.  No significant change from 01/15/2019.   Assessment     ICD-10-CM   1. Atherosclerosis of native coronary artery of native heart with stable angina pectoris (HCC)  I25.118   2. Essential hypertension  I10 EKG 12-Lead  3. Asymptomatic bilateral carotid artery stenosis  I65.23 PCV CAROTID DUPLEX (BILATERAL)  4. Hypercholesteremia  E78.00   5. Generalized anxiety disorder  F41.1 ALPRAZolam (XANAX) 0.25 MG tablet    Meds ordered this encounter  Medications  . ALPRAZolam (XANAX) 0.25 MG tablet    Sig: Take 1 tablet (0.25 mg total) by mouth daily as needed for anxiety.    Dispense:  30 tablet    Refill:  5     Outpatient Encounter Medications as of 06/24/2020  Medication Sig  . ALPRAZolam (XANAX) 0.25 MG tablet Take 1 tablet (0.25 mg total) by mouth daily as needed for anxiety.  Marland Kitchen. aspirin 81 MG tablet Take 81 mg by mouth daily.  . carvedilol (COREG) 6.25 MG tablet Take 1 tablet (6.25 mg total) by mouth 2 (two) times daily.  . Cholecalciferol (VITAMIN D) 2000 units CAPS Take 2,000 Units by mouth daily.  . CRESTOR 20 MG tablet Take 0.5 tablets (10 mg total) by mouth daily. Patient reduced due to myalgia  . ezetimibe (ZETIA) 10 MG tablet Take 1 tablet (10 mg total) by mouth daily.  Marland Kitchen. ibuprofen (ADVIL,MOTRIN) 200 MG tablet Take 200 mg by mouth every 6 (six) hours as needed for moderate pain.  . magnesium 30 MG tablet Take 30 mg by mouth once a week.  . nitroGLYCERIN (NITROSTAT) 0.4 MG SL tablet Place 0.4 mg under the tongue every 5 (five) minutes as needed for chest pain.  . ranolazine (RANEXA) 500 MG 12 hr tablet TAKE 1 TABLET(500 MG) BY MOUTH TWICE DAILY AS NEEDED  . valsartan (DIOVAN) 80 MG tablet Take 1 tablet (80 mg total) by mouth daily.   No facility-administered encounter medications on  file as of 06/24/2020.   Recommendations:   Lamaya Hyneman  is a 65 y.o. Caucasian female with hypertension, hyperglycemia,family history of premature coronary artery disease, multiple  medication intolerances, hyperlipidemia, CAD S/P 3.0 x 18 mm Cypher drug-eluting stent to mid RCA placed on 05/16/2003. Coronary angiography on 06/07/2018, Severe heavy calcification was evident in all 3 coronary vessels with patent RCA stent and has a 70% To 75% mid to distal LAD stenosis, probably physiologically significant but was recommended medical therapy in view of diffuse calcification and uncontrolled hyperlipidemia.  She is doing the best in a while, trying to take her medications especially statins on a regular basis.  Symptoms of angina has remained stable with Ranexa.  Blood pressure is also well controlled, brings home blood pressure recordings. Hence I did not make any changes to her medications today.  She has not had any recurrence of angina pectoris and no symptoms to suggest TIA.  She is now tolerating Crestor and Zetia and I have achieved LDL goal less than 100, but prefer to be less than 70, however she had had much difficulty with statins and also PCSK9 inhibitors. She could also try siRNA molecules (Leqvio: Inclisiran) is an options but patient would like to think about this.  She has generalized anxiety disorder, I prescribed her Xanax in the past and she has not abused this, I have refilled the prescription, 30 tablets with 5 refills.  Will inform PCP.  With regard to carotid atherosclerosis and carotid stenosis, she needs surveillance Dopplers. I will see her back in 6 months.   Yates Decamp, MD, [facility] 06/24/2020, 6:21 PM Office: [phone] Fax: [phone] Pager: [phone]

## 2020-06-25 DIAGNOSIS — M25561 Pain in right knee: Secondary | ICD-10-CM | POA: Diagnosis not present

## 2020-07-01 DIAGNOSIS — M25561 Pain in right knee: Secondary | ICD-10-CM | POA: Diagnosis not present

## 2020-07-01 DIAGNOSIS — M1711 Unilateral primary osteoarthritis, right knee: Secondary | ICD-10-CM | POA: Diagnosis not present

## 2020-07-20 DIAGNOSIS — M2241 Chondromalacia patellae, right knee: Secondary | ICD-10-CM | POA: Diagnosis not present

## 2020-07-20 DIAGNOSIS — M23331 Other meniscus derangements, other medial meniscus, right knee: Secondary | ICD-10-CM | POA: Diagnosis not present

## 2020-08-18 ENCOUNTER — Ambulatory Visit: Payer: BC Managed Care – PPO

## 2020-08-18 ENCOUNTER — Other Ambulatory Visit: Payer: Self-pay

## 2020-08-18 DIAGNOSIS — I6523 Occlusion and stenosis of bilateral carotid arteries: Secondary | ICD-10-CM | POA: Diagnosis not present

## 2020-08-22 ENCOUNTER — Emergency Department (HOSPITAL_COMMUNITY)
Admission: EM | Admit: 2020-08-22 | Discharge: 2020-08-22 | Disposition: A | Discharge status: Home / Self Care | Payer: BC Managed Care – PPO | Attending: Emergency Medicine | Admitting: Emergency Medicine

## 2020-08-22 ENCOUNTER — Emergency Department (HOSPITAL_COMMUNITY): Payer: BC Managed Care – PPO

## 2020-08-22 ENCOUNTER — Other Ambulatory Visit: Payer: Self-pay

## 2020-08-22 DIAGNOSIS — Z7982 Long term (current) use of aspirin: Secondary | ICD-10-CM | POA: Diagnosis not present

## 2020-08-22 DIAGNOSIS — R079 Chest pain, unspecified: Secondary | ICD-10-CM | POA: Diagnosis not present

## 2020-08-22 DIAGNOSIS — I10 Essential (primary) hypertension: Secondary | ICD-10-CM | POA: Diagnosis not present

## 2020-08-22 DIAGNOSIS — R1013 Epigastric pain: Secondary | ICD-10-CM | POA: Insufficient documentation

## 2020-08-22 DIAGNOSIS — R072 Precordial pain: Secondary | ICD-10-CM | POA: Diagnosis not present

## 2020-08-22 DIAGNOSIS — Z87891 Personal history of nicotine dependence: Secondary | ICD-10-CM | POA: Insufficient documentation

## 2020-08-22 DIAGNOSIS — Z79899 Other long term (current) drug therapy: Secondary | ICD-10-CM | POA: Insufficient documentation

## 2020-08-22 DIAGNOSIS — R0602 Shortness of breath: Secondary | ICD-10-CM | POA: Insufficient documentation

## 2020-08-22 DIAGNOSIS — Z8616 Personal history of COVID-19: Secondary | ICD-10-CM | POA: Diagnosis not present

## 2020-08-22 DIAGNOSIS — I2510 Atherosclerotic heart disease of native coronary artery without angina pectoris: Secondary | ICD-10-CM | POA: Insufficient documentation

## 2020-08-22 LAB — HEPATIC FUNCTION PANEL
ALT: 13 U/L (ref 0–44)
AST: 19 U/L (ref 15–41)
Albumin: 3.9 g/dL (ref 3.5–5.0)
Alkaline Phosphatase: 57 U/L (ref 38–126)
Bilirubin, Direct: <0.1 mg/dL (ref 0.0–0.2)
Total Bilirubin: 0.2 mg/dL — ABNORMAL LOW (ref 0.3–1.2)
Total Protein: 6.9 g/dL (ref 6.5–8.1)

## 2020-08-22 LAB — BASIC METABOLIC PANEL
Anion gap: 7 (ref 5–15)
BUN: 17 mg/dL (ref 8–23)
CO2: 26 mmol/L (ref 22–32)
Calcium: 9.7 mg/dL (ref 8.9–10.3)
Chloride: 104 mmol/L (ref 98–111)
Creatinine, Ser: 0.9 mg/dL (ref 0.44–1.00)
GFR, Estimated: >60 mL/min (ref >60)
Glucose, Bld: 131 mg/dL — ABNORMAL HIGH (ref 70–99)
Potassium: 4.3 mmol/L (ref 3.5–5.1)
Sodium: 137 mmol/L (ref 135–145)

## 2020-08-22 LAB — CBC
HCT: 41 % (ref 36.0–46.0)
Hemoglobin: 13.6 g/dL (ref 12.0–15.0)
MCH: 30.8 pg (ref 26.0–34.0)
MCHC: 33.2 g/dL (ref 30.0–36.0)
MCV: 92.8 fL (ref 80.0–100.0)
Platelets: 301 10*3/uL (ref 150–400)
RBC: 4.42 MIL/uL (ref 3.87–5.11)
RDW: 12.2 % (ref 11.5–15.5)
WBC: 7.2 10*3/uL (ref 4.0–10.5)
nRBC: 0 % (ref 0.0–0.2)

## 2020-08-22 LAB — TROPONIN I (HIGH SENSITIVITY)
Troponin I (High Sensitivity): 6 ng/L (ref <18)
Troponin I (High Sensitivity): 9 ng/L (ref <18)

## 2020-08-22 LAB — LIPASE, BLOOD: Lipase: 30 U/L (ref 11–51)

## 2020-08-22 MED ORDER — NITROGLYCERIN 0.4 MG SL SUBL: 0.4000 mg | SUBLINGUAL_TABLET | SUBLINGUAL | Status: DC | PRN

## 2020-08-22 MED ORDER — ASPIRIN 81 MG PO CHEW
324.0000 mg | CHEWABLE_TABLET | Freq: Once | ORAL | Status: AC
  Administered 2020-08-22: 324 mg via ORAL
  Filled 2020-08-22: qty 4

## 2020-08-22 MED ORDER — OMEPRAZOLE 20 MG PO CPDR: 20.0000 mg | DELAYED_RELEASE_CAPSULE | Freq: Two times a day (BID) | ORAL | 0 refills | Status: AC

## 2020-08-22 MED ORDER — LIDOCAINE VISCOUS HCL 2 % MT SOLN
15.0000 mL | Freq: Once | OROMUCOSAL | Status: AC
  Administered 2020-08-22: 15 mL via ORAL
  Filled 2020-08-22: qty 15

## 2020-08-22 MED ORDER — PANTOPRAZOLE SODIUM 40 MG IV SOLR
40.0000 mg | Freq: Once | INTRAVENOUS | Status: DC
  Filled 2020-08-22: qty 40

## 2020-08-22 MED ORDER — ALUM & MAG HYDROXIDE-SIMETH 200-200-20 MG/5ML PO SUSP
30.0000 mL | Freq: Once | ORAL | Status: AC
  Administered 2020-08-22: 30 mL via ORAL
  Filled 2020-08-22: qty 30

## 2020-08-22 NOTE — ED Provider Notes (Signed)
[facility] EMERGENCY DEPARTMENT Provider Note   CSN: [id number] Arrival date & time: 08/22/20  0119     History Chief Complaint  Patient presents with   Chest Pain    Tiffany Ryan is a 65 y.o. female.  The history is provided by the patient, medical records and the spouse. No language interpreter was used.  Chest Pain Tiffany Ryan is a 65 y.o. female who presents to the Emergency Department complaining of chest pain.  She presents to the ED accompanied by her husband for chest pain for the last 2-3 days.  Initially pain was waxing and waning but it has been constant since this afternoon.  Pain is located in the central chest and described as sharp and burning with associated sob.  Sxs mildly improved with nitro.    No fever, cough, diaphoresis, N/V/D, leg swelling.     Had some abdominal discomfort in the epigastric region.   Has a hx/o CAD s/p stent x 2.  Feels different than prior stent.    No hormone use. No history of DVT/PE. No recent surgeries or prolonged travel.  Past Medical History:  Diagnosis Date   CAD (coronary artery disease), native coronary artery 01/20/2019   COVID-19    High cholesterol    Hyperlipemia 08/06/2018   Hypertension    Unstable angina (HCC) 03/06/2016    Patient Active Problem List   Diagnosis Date Noted   CAD (coronary artery disease), native coronary artery 01/20/2019   Hyperlipemia 08/06/2018   Hypertension 06/21/2018    Past Surgical History:  Procedure Laterality Date   ABDOMINAL AORTOGRAM N/A 03/07/2016   Procedure: Abdominal Aortogram;  Surgeon: Yates Decamp, MD;  Location: [facility] INVASIVE CV LAB;  Service: Cardiovascular;  Laterality: N/A;   CARDIAC SURGERY     stent x 2   COLPOSCOPY  04/2018   CORONARY ANGIOGRAPHY N/A 03/07/2016   Procedure: Coronary Angiography and possible PCI;  Surgeon: Yates Decamp, MD;  Location: [facility] INVASIVE CV LAB;  Service: Cardiovascular;  Laterality: N/A;  07:30 Case please   LEFT HEART CATH AND  CORONARY ANGIOGRAPHY N/A 06/07/2018   Procedure: LEFT HEART CATH AND CORONARY ANGIOGRAPHY;  Surgeon: Yates Decamp, MD;  Location: [facility] INVASIVE CV LAB;  Service: Cardiovascular;  Laterality: N/A;     OB History   No obstetric history on file.     Family History  Problem Relation Age of Onset   Stroke Father     Social History   Tobacco Use   Smoking status: Former    Packs/day: 1.00    Years: 25.00    Pack years: 25.00    Types: Cigarettes    Quit date: 2006    Years since quitting: 16.5   Smokeless tobacco: Never  Vaping Use   Vaping Use: Never used  Substance Use Topics   Alcohol use: Yes    Alcohol/week: 1.0 standard drink    Types: 1 Glasses of wine per week    Comment: OCCASSIONAL   Drug use: No    Home Medications Prior to Admission medications   Medication Sig Start Date End Date Taking? Authorizing Provider  omeprazole (PRILOSEC) 20 MG capsule Take 1 capsule (20 mg total) by mouth 2 (two) times daily before a meal. 08/22/20  Yes Tilden Fossa, MD  ALPRAZolam Prudy Feeler) 0.25 MG tablet Take 1 tablet (0.25 mg total) by mouth daily as needed for anxiety. 06/24/20   Yates Decamp, MD  aspirin 81 MG tablet Take 81 mg by mouth daily.  [provider]  carvedilol (COREG) 6.25 MG tablet Take 1 tablet (6.25 mg total) by mouth 2 (two) times daily. 10/18/19   Yates Decamp, MD  Cholecalciferol (VITAMIN D) 2000 units CAPS Take 2,000 Units by mouth daily.    [provider]  CRESTOR 20 MG tablet Take 0.5 tablets (10 mg total) by mouth daily. Patient reduced due to myalgia 12/24/19   Yates Decamp, MD  ezetimibe (ZETIA) 10 MG tablet Take 1 tablet (10 mg total) by mouth daily. 02/13/19   Yates Decamp, MD  ibuprofen (ADVIL,MOTRIN) 200 MG tablet Take 200 mg by mouth every 6 (six) hours as needed for moderate pain.    [provider]  magnesium 30 MG tablet Take 30 mg by mouth once a week.    [provider]  nitroGLYCERIN (NITROSTAT) 0.4 MG SL tablet Place 0.4 mg  under the tongue every 5 (five) minutes as needed for chest pain.    [provider]  ranolazine (RANEXA) 500 MG 12 hr tablet TAKE 1 TABLET(500 MG) BY MOUTH TWICE DAILY AS NEEDED 05/19/20   Yates Decamp, MD  valsartan (DIOVAN) 80 MG tablet Take 1 tablet (80 mg total) by mouth daily. 09/27/19   Yates Decamp, MD    Allergies    Ramipril, Amlodipine, and Repatha [evolocumab]  Review of Systems   Review of Systems  Cardiovascular:  Positive for chest pain.  All other systems reviewed and are negative.  Physical Exam Updated Vital Signs BP 135/65   Pulse 69   Temp (!) 97.4 F (36.3 C) (Oral)   Resp 18   SpO2 96%   Physical Exam Vitals and nursing note reviewed.  Constitutional:      Appearance: She is well-developed.  HENT:     Head: Normocephalic and atraumatic.  Cardiovascular:     Rate and Rhythm: Normal rate and regular rhythm.     Heart sounds: No murmur heard. Pulmonary:     Effort: Pulmonary effort is normal. No respiratory distress.     Breath sounds: Normal breath sounds.  Abdominal:     Palpations: Abdomen is soft.     Tenderness: There is no abdominal tenderness. There is no guarding or rebound.  Musculoskeletal:        General: No swelling or tenderness.  Skin:    General: Skin is warm and dry.  Neurological:     Mental Status: She is alert and oriented to person, place, and time.  Psychiatric:        Behavior: Behavior normal.    ED Results / Procedures / Treatments   Labs (all labs ordered are listed, but only abnormal results are displayed) Labs Reviewed  BASIC METABOLIC PANEL - Abnormal; Notable for the following components:      Result Value   Glucose, Bld 131 (*)    All other components within normal limits  HEPATIC FUNCTION PANEL - Abnormal; Notable for the following components:   Total Bilirubin 0.2 (*)    All other components within normal limits  CBC  LIPASE, BLOOD  TROPONIN I (HIGH SENSITIVITY)  TROPONIN I (HIGH SENSITIVITY)     EKG EKG Interpretation  Date/Time:  Saturday August 22 2020 01:31:33 EDT Ventricular Rate:  95 PR Interval:  176 QRS Duration: 140 QT Interval:  414 QTC Calculation: 520 R Axis:   58 Text Interpretation: Normal sinus rhythm Left bundle branch block Abnormal ECG Confirmed by Tilden Fossa [phone]) on 08/22/2020 1:38:57 AM  Radiology DG Chest 2 View  Result Date: 08/22/2020  CLINICAL DATA:  Chest pain.  Intermittent pain for 2 days. EXAM: CHEST - 2 VIEW COMPARISON:  Radiograph 02/23/2019 FINDINGS: The cardiomediastinal contours are normal. Atherosclerosis of the aortic arch. Pulmonary vasculature is normal. No consolidation, pleural effusion, or pneumothorax. No acute osseous abnormalities are seen. IMPRESSION: No acute chest findings. Electronically Signed   By: Narda Rutherford M.D.   On: 08/22/2020 01:56    Procedures Procedures   Medications Ordered in ED Medications  nitroGLYCERIN (NITROSTAT) SL tablet 0.4 mg (has no administration in time range)  nitroGLYCERIN (NITROSTAT) SL tablet 0.4 mg (has no administration in time range)  pantoprazole (PROTONIX) injection 40 mg (40 mg Intravenous Not Given 08/22/20 0440)  aspirin chewable tablet 324 mg (324 mg Oral Given 08/22/20 0439)  alum & mag hydroxide-simeth (MAALOX/MYLANTA) 200-200-20 MG/5ML suspension 30 mL (30 mLs Oral Given 08/22/20 0439)    And  lidocaine (XYLOCAINE) 2 % viscous mouth solution 15 mL (15 mLs Oral Given 08/22/20 0439)    ED Course  I have reviewed the triage vital signs and the nursing notes.  Pertinent labs & imaging results that were available during my care of the patient were reviewed by me and considered in my medical decision making (see chart for details).    MDM Rules/Calculators/A&P                          patient with history of coronary artery disease here for evaluation of several days of chest pain. She is non-toxic appearing on evaluation. EKG with left bundle branch block, similar when compared  to priors. Troponins are negative times two. Presentation is not consistent with ACS, PE, dissection, pneumonia. Symptoms are partially improved after G.I. cocktail. She has not been taking her antacid recently. Question if there is some reflux contributing to her symptoms. Feels she is stable for discharge home at this time with cardiology follow-up. Recommend re-starting PPI with return precautions if she has progressive or new worrisome symptoms.  Final Clinical Impression(s) / ED Diagnoses Final diagnoses:  Precordial pain    Rx / DC Orders ED Discharge Orders          Ordered    omeprazole (PRILOSEC) 20 MG capsule  2 times daily before meals        08/22/20 0559             Tilden Fossa, MD 08/22/20 [phone]

## 2020-08-22 NOTE — ED Triage Notes (Signed)
Pt c/o intermittent chest pain and SOB over past few days worsening today. HX stents.

## 2020-08-22 NOTE — ED Provider Notes (Signed)
Emergency Medicine Provider Triage Evaluation Note  Tiffany Ryan , a 65 y.o. female  was evaluated in triage.  Pt complains of chest pain.  She reports intermittent symptoms for the past 2 days, but starting at 3 PM today it has been constant.  She reports associated shortness of breath.  Denies any pain in her back.  Denies fever, chills, cough.  Has history of cardiac stents.  Took aspirin earlier.  Review of Systems  Positive: CP, SOB Negative: Fever, cough  Physical Exam  BP (!) 173/104 (BP Location: Right Arm)   Pulse 75   Temp (!) 97.4 F (36.3 C) (Oral)   Resp 19   SpO2 100%  Gen:   Awake, uncomfortable appearing  Resp:  Normal effort  MSK:   Moves extremities without difficulty  Other:    Medical Decision Making  Medically screening exam initiated at 1:33 AM.  Appropriate orders placed.  Valari Suleiman was informed that the remainder of the evaluation will be completed by another provider, this initial triage assessment does not replace that evaluation, and the importance of remaining in the ED until their evaluation is complete.  CP   Roxy Horseman, PA-C 08/22/20 [id number]    Palumbo, April, MD 08/22/20 [phone]

## 2020-08-26 DIAGNOSIS — K219 Gastro-esophageal reflux disease without esophagitis: Secondary | ICD-10-CM | POA: Diagnosis not present

## 2020-08-26 DIAGNOSIS — R1011 Right upper quadrant pain: Secondary | ICD-10-CM | POA: Diagnosis not present

## 2020-08-26 DIAGNOSIS — R079 Chest pain, unspecified: Secondary | ICD-10-CM | POA: Diagnosis not present

## 2020-08-28 DIAGNOSIS — R1011 Right upper quadrant pain: Secondary | ICD-10-CM | POA: Diagnosis not present

## 2020-09-16 DIAGNOSIS — R1011 Right upper quadrant pain: Secondary | ICD-10-CM | POA: Diagnosis not present

## 2020-09-16 DIAGNOSIS — K219 Gastro-esophageal reflux disease without esophagitis: Secondary | ICD-10-CM | POA: Diagnosis not present

## 2020-11-12 DIAGNOSIS — R739 Hyperglycemia, unspecified: Secondary | ICD-10-CM | POA: Diagnosis not present

## 2020-11-12 DIAGNOSIS — Z79899 Other long term (current) drug therapy: Secondary | ICD-10-CM | POA: Diagnosis not present

## 2020-11-12 DIAGNOSIS — I10 Essential (primary) hypertension: Secondary | ICD-10-CM | POA: Diagnosis not present

## 2020-11-12 DIAGNOSIS — M549 Dorsalgia, unspecified: Secondary | ICD-10-CM | POA: Diagnosis not present

## 2020-11-12 DIAGNOSIS — Z0000 Encounter for general adult medical examination without abnormal findings: Secondary | ICD-10-CM | POA: Diagnosis not present

## 2020-11-12 DIAGNOSIS — R079 Chest pain, unspecified: Secondary | ICD-10-CM | POA: Diagnosis not present

## 2020-11-12 DIAGNOSIS — E785 Hyperlipidemia, unspecified: Secondary | ICD-10-CM | POA: Diagnosis not present

## 2020-11-12 DIAGNOSIS — E559 Vitamin D deficiency, unspecified: Secondary | ICD-10-CM | POA: Diagnosis not present

## 2020-11-16 ENCOUNTER — Other Ambulatory Visit: Payer: Self-pay | Admitting: Cardiology

## 2020-11-16 DIAGNOSIS — I10 Essential (primary) hypertension: Secondary | ICD-10-CM

## 2020-11-18 DIAGNOSIS — I10 Essential (primary) hypertension: Secondary | ICD-10-CM | POA: Diagnosis not present

## 2020-11-18 DIAGNOSIS — R079 Chest pain, unspecified: Secondary | ICD-10-CM | POA: Diagnosis not present

## 2020-11-19 NOTE — Telephone Encounter (Signed)
From pt

## 2020-12-02 DIAGNOSIS — M549 Dorsalgia, unspecified: Secondary | ICD-10-CM | POA: Diagnosis not present

## 2020-12-02 DIAGNOSIS — K219 Gastro-esophageal reflux disease without esophagitis: Secondary | ICD-10-CM | POA: Diagnosis not present

## 2020-12-02 DIAGNOSIS — R079 Chest pain, unspecified: Secondary | ICD-10-CM | POA: Diagnosis not present

## 2020-12-05 ENCOUNTER — Encounter: Payer: Self-pay | Admitting: Cardiology

## 2020-12-08 DIAGNOSIS — R0789 Other chest pain: Secondary | ICD-10-CM | POA: Diagnosis not present

## 2020-12-08 DIAGNOSIS — K3189 Other diseases of stomach and duodenum: Secondary | ICD-10-CM | POA: Diagnosis not present

## 2020-12-08 DIAGNOSIS — R079 Chest pain, unspecified: Secondary | ICD-10-CM | POA: Diagnosis not present

## 2020-12-08 DIAGNOSIS — K219 Gastro-esophageal reflux disease without esophagitis: Secondary | ICD-10-CM | POA: Diagnosis not present

## 2020-12-08 DIAGNOSIS — R12 Heartburn: Secondary | ICD-10-CM | POA: Diagnosis not present

## 2020-12-08 DIAGNOSIS — K319 Disease of stomach and duodenum, unspecified: Secondary | ICD-10-CM | POA: Diagnosis not present

## 2020-12-09 NOTE — Progress Notes (Signed)
Normal EGD

## 2021-01-04 DIAGNOSIS — K219 Gastro-esophageal reflux disease without esophagitis: Secondary | ICD-10-CM | POA: Diagnosis not present

## 2021-02-26 ENCOUNTER — Other Ambulatory Visit: Payer: Self-pay | Admitting: Internal Medicine

## 2021-02-26 ENCOUNTER — Ambulatory Visit
Admission: RE | Admit: 2021-02-26 | Discharge: 2021-02-26 | Disposition: A | Discharge status: Home / Self Care | Payer: BC Managed Care – PPO | Source: Ambulatory Visit | Attending: Internal Medicine | Admitting: Internal Medicine

## 2021-02-26 DIAGNOSIS — G44229 Chronic tension-type headache, not intractable: Secondary | ICD-10-CM | POA: Diagnosis not present

## 2021-02-26 DIAGNOSIS — I10 Essential (primary) hypertension: Secondary | ICD-10-CM | POA: Diagnosis not present

## 2021-02-26 DIAGNOSIS — M542 Cervicalgia: Secondary | ICD-10-CM | POA: Diagnosis not present

## 2021-03-04 ENCOUNTER — Other Ambulatory Visit: Payer: Self-pay | Admitting: Cardiology

## 2021-05-04 ENCOUNTER — Other Ambulatory Visit: Payer: Self-pay | Admitting: Internal Medicine

## 2021-05-04 ENCOUNTER — Ambulatory Visit
Admission: RE | Admit: 2021-05-04 | Discharge: 2021-05-04 | Disposition: A | Discharge status: Home / Self Care | Payer: BC Managed Care – PPO | Source: Ambulatory Visit | Attending: Internal Medicine | Admitting: Internal Medicine

## 2021-05-04 DIAGNOSIS — R079 Chest pain, unspecified: Secondary | ICD-10-CM | POA: Diagnosis not present

## 2021-05-04 DIAGNOSIS — R0602 Shortness of breath: Secondary | ICD-10-CM | POA: Diagnosis not present

## 2021-05-06 DIAGNOSIS — H02413 Mechanical ptosis of bilateral eyelids: Secondary | ICD-10-CM | POA: Diagnosis not present

## 2021-05-06 DIAGNOSIS — H0279 Other degenerative disorders of eyelid and periocular area: Secondary | ICD-10-CM | POA: Diagnosis not present

## 2021-05-06 DIAGNOSIS — H57813 Brow ptosis, bilateral: Secondary | ICD-10-CM | POA: Diagnosis not present

## 2021-05-06 DIAGNOSIS — H02834 Dermatochalasis of left upper eyelid: Secondary | ICD-10-CM | POA: Diagnosis not present

## 2021-05-06 DIAGNOSIS — H02831 Dermatochalasis of right upper eyelid: Secondary | ICD-10-CM | POA: Diagnosis not present

## 2021-05-10 DIAGNOSIS — K219 Gastro-esophageal reflux disease without esophagitis: Secondary | ICD-10-CM | POA: Diagnosis not present

## 2021-05-10 DIAGNOSIS — R079 Chest pain, unspecified: Secondary | ICD-10-CM | POA: Diagnosis not present

## 2021-05-19 ENCOUNTER — Encounter: Payer: Self-pay | Admitting: Cardiology

## 2021-05-19 ENCOUNTER — Ambulatory Visit: Payer: BC Managed Care – PPO | Admitting: Cardiology

## 2021-05-19 VITALS — BP 151/83 | HR 81 | Temp 98.2°F | Resp 16 | Ht 63.0 in | Wt 160.2 lb

## 2021-05-19 DIAGNOSIS — I6523 Occlusion and stenosis of bilateral carotid arteries: Secondary | ICD-10-CM | POA: Diagnosis not present

## 2021-05-19 DIAGNOSIS — I25118 Atherosclerotic heart disease of native coronary artery with other forms of angina pectoris: Secondary | ICD-10-CM

## 2021-05-19 DIAGNOSIS — E7800 Pure hypercholesterolemia, unspecified: Secondary | ICD-10-CM

## 2021-05-19 DIAGNOSIS — R739 Hyperglycemia, unspecified: Secondary | ICD-10-CM

## 2021-05-19 DIAGNOSIS — I447 Left bundle-branch block, unspecified: Secondary | ICD-10-CM

## 2021-05-19 DIAGNOSIS — I10 Essential (primary) hypertension: Secondary | ICD-10-CM

## 2021-05-19 DIAGNOSIS — H53483 Generalized contraction of visual field, bilateral: Secondary | ICD-10-CM | POA: Diagnosis not present

## 2021-05-19 MED ORDER — SPIRONOLACTONE-HCTZ 25-25 MG PO TABS: 1.0000 | ORAL_TABLET | ORAL | 3 refills | Status: DC

## 2021-05-19 NOTE — Progress Notes (Signed)
? ?Primary Physician/Referring:  Marden NobleGates, Robert, MD ? ?Patient ID: Tiffany KailSava Kogler, female    DOB: 21-Mar-1955, 66 y.o.   MRN: [id number] ? ?Chief Complaint  ?Patient presents with  ? Coronary Artery Disease  ? Hyperlipidemia  ? Follow-up  ? ?HPI:   ? ?Tiffany Ryan  is a 66 y.o. Caucasian female with hypertension, hyperglycemia,family history of premature coronary artery disease, multiple medication intolerances, hyperlipidemia, CAD S/P 3.0 x 18 mm Cypher drug-eluting stent to mid RCA placed on 05/16/2003. Coronary angiography on 06/07/2018 revealing severe heavy calcification in all 3 coronary vessels with patent RCA stent and has a 70% to 75% mid to distal LAD stenosis, probably physiologically significant but was recommended medical therapy in view of diffuse calcification and uncontrolled hyperlipidemia. ? ?She presents for annual visit, has discontinued taking carvedilol stating that she was having abdominal discomfort.  She also discontinued valsartan as it gave her headache and neck pain.  ? ?She continues to have chest pain that is always there, but is able to walk for 5 miles without any chest pain but states that when she returns home she again has chest pain and back pain that is persistent throughout the day.  No change with position.  No other associated symptoms.  Denies dyspnea, leg edema. ? ?Past Medical History:  ?Diagnosis Date  ? CAD (coronary artery disease), native coronary artery 01/20/2019  ? COVID-19   ? High cholesterol   ? Hyperlipemia 08/06/2018  ? Hypertension   ? Unstable angina (HCC) 03/06/2016  ? ? ?Family History  ?Problem Relation Age of Onset  ? Stroke Father   ?  ?Social History  ? ?Tobacco Use  ? Smoking status: Former  ?  Packs/day: 1.00  ?  Years: 25.00  ?  Pack years: 25.00  ?  Types: Cigarettes  ?  Quit date: 2006  ?  Years since quitting: 17.3  ? Smokeless tobacco: Never  ?Substance Use Topics  ? Alcohol use: Yes  ?  Alcohol/week: 1.0 standard drink  ?  Types: 1 Glasses of wine per week  ?   Comment: OCCASSIONAL  ? ?Marital Status: Married ? ?ROS  ?Review of Systems  ?Cardiovascular:  Positive for chest pain. Negative for dyspnea on exertion and leg swelling.  ?Musculoskeletal:  Positive for arthritis.  ?Gastrointestinal:  Negative for melena.  ?Psychiatric/Behavioral:  Positive for depression. The patient is nervous/anxious.   ?Objective  ?Blood pressure (!) 151/83, pulse 81, temperature 98.2 ?F (36.8 ?C), temperature source Temporal, resp. rate 16, height 5\' 3"  (1.6 m), weight 160 lb 3.2 oz (72.7 kg), SpO2 96 %.  ? ?  05/19/2021  ? 11:21 AM 08/22/2020  ?  6:45 AM 08/22/2020  ?  5:45 AM  ?Vitals with BMI  ?Height 5\' 3"     ?Weight 160 lbs 3 oz    ?BMI 28.39    ?Systolic 151 127 [id number]  ?Diastolic 83 77 65  ?Pulse 81 60 69  ?  ? Physical Exam ?Vitals reviewed.  ?Constitutional:   ?   Appearance: She is well-developed.  ?Neck:  ?   Vascular: No JVD.  ?Cardiovascular:  ?   Rate and Rhythm: Normal rate and regular rhythm.  ?   Pulses: Intact distal pulses.     ?     Carotid pulses are  on the right side with bruit and  on the left side with bruit. ?     Femoral pulses are 2+ on the right side and 2+ on the left side. ?  Popliteal pulses are 2+ on the right side and 2+ on the left side.  ?     Dorsalis pedis pulses are 1+ on the right side and 1+ on the left side.  ?     Posterior tibial pulses are 1+ on the right side and 1+ on the left side.  ?   Heart sounds: Normal heart sounds.  ?Pulmonary:  ?   Effort: Pulmonary effort is normal. No accessory muscle usage or respiratory distress.  ?   Breath sounds: Normal breath sounds.  ?Abdominal:  ?   General: Bowel sounds are normal.  ?   Palpations: Abdomen is soft.  ?Musculoskeletal:     ?   General: No swelling. Normal range of motion.  ? ?Laboratory examination:  ? ?Recent Labs  ?  06/19/20 ?0834 08/22/20 ?0143  ?NA 141 137  ?K 4.8 4.3  ?CL 103 104  ?CO2 26 26  ?GLUCOSE 111* 131*  ?BUN 13 17  ?CREATININE 0.85 0.90  ?CALCIUM 9.8 9.7  ?GFRNONAA  --  >60  ? ?CrCl  cannot be calculated (Patient's most recent lab result is older than the maximum 21 days allowed.).  ? ?  Latest Ref Rng & Units 08/22/2020  ?  1:43 AM 06/19/2020  ?  8:34 AM 06/21/2019  ?  8:14 AM  ?CMP  ?Glucose 70 - 99 mg/dL 810   175   102    ?BUN 8 - 23 mg/dL 17   13   12     ?Creatinine 0.44 - 1.00 mg/dL   5.85   2.77    ?Sodium 135 - 145 mmol/L 137   141   142    ?Potassium 3.5 - 5.1 mmol/L 4.3   4.8   5.1    ?Chloride 98 - 111 mmol/L 104   103   106    ?CO2 22 - 32 mmol/L 26   26   23     ?Calcium 8.9 - 10.3 mg/dL 9.7   9.8   9.7    ?Total Protein 6.5 - 8.1 g/dL 6.9   6.9   7.0    ?Total Bilirubin 0.3 - 1.2 mg/dL 0.2   0.4   0.3    ?Alkaline Phos 38 - 126 U/L 57   65   65    ?AST 15 - 41 U/L 19   13   20     ?ALT 0 - 44 U/L 13   13   13     ? ? ?  Latest Ref Rng & Units 08/22/2020  ?  1:43 AM 02/23/2019  ?  4:12 PM 06/07/2018  ?  5:09 AM  ?CBC  ?WBC 4.0 - 10.5 K/uL 7.2   5.3   7.0    ?Hemoglobin 12.0 - 15.0 g/dL   08/24/2020   02/25/2019    ?Hematocrit 36.0 - 46.0 % 41.0   43.9   37.5    ?Platelets 150 - 400 K/uL 301   290   299    ? ? ?Lipid Panel ?Recent Labs  ?  06/19/20 ?0835  ?CHOL 165  ?TRIG 86  ?LDLCALC 100*  ?HDL 49  ? HEMOGLOBIN A1C ?Lab Results  ?Component Value Date  ? HGBA1C 6.1 (H) 06/19/2020  ? MPG 131.24 06/06/2018  ? ?External Labs: ?TSH 2.150 10/31/2018 ? ?Medications and allergies  ? ?Allergies  ?Allergen Reactions  ? Ramipril Cough  ? Carvedilol Other (See Comments)  ?  Neck pain  ? Valsartan Other (See Comments)  ?  Bloating  ? Amlodipine Other (See Comments)  ?  Headache  ? Repatha [Evolocumab] Other (See Comments)  ?  Neck tightness  ?  ? ?Current Outpatient Medications:  ?  ALPRAZolam (XANAX) 0.25 MG tablet, Take 1 tablet (0.25 mg total) by mouth daily as needed for anxiety., Disp: 30 tablet, Rfl: 5 ?  aspirin 81 MG tablet, Take 81 mg by mouth daily., Disp: , Rfl:  ?  Cholecalciferol (VITAMIN D) 2000 units CAPS, Take 2,000 Units by mouth daily., Disp: , Rfl:  ?  CRESTOR 20 MG tablet, Take 0.5  tablets (10 mg total) by mouth daily. Patient reduced due to myalgia, Disp: 90 tablet, Rfl: 3 ?  ezetimibe (ZETIA) 10 MG tablet, Take 1 tablet (10 mg total) by mouth daily., Disp: 90 tablet, Rfl: 3 ?  ibuprofen (ADVIL,MOTRIN) 200 MG tablet, Take 200 mg by mouth every 6 (six) hours as needed for moderate pain., Disp: , Rfl:  ?  magnesium 30 MG tablet, Take 30 mg by mouth once a week., Disp: , Rfl:  ?  nitroGLYCERIN (NITROSTAT) 0.4 MG SL tablet, Place 0.4 mg under the tongue every 5 (five) minutes as needed for chest pain., Disp: , Rfl:  ?  omeprazole (PRILOSEC) 20 MG capsule, Take 1 capsule (20 mg total) by mouth 2 (two) times daily before a meal. (Patient taking differently: Take 20 mg by mouth daily as needed.), Disp: 28 capsule, Rfl: 0 ?  ranolazine (RANEXA) 500 MG 12 hr tablet, TAKE 1 TABLET(500 MG) BY MOUTH TWICE DAILY AS NEEDED, Disp: 180 tablet, Rfl: 1 ?  spironolactone-hydrochlorothiazide (ALDACTAZIDE) 25-25 MG tablet, Take 1 tablet by mouth every morning., Disp: 90 tablet, Rfl: 3  ?Radiology:  ? ?No results found. ? ?Cardiac Studies:  ? ?Stress test 03/08/2015: Patient exercised on Bruce protocol for 9 minutes and 28 seconds and achieved 106% of MPHR.  Occasional PVCs during stress and recovery period, underlying LBBB.Marland Kitchen  Normal blood pressure response.  Poststress LVEF 60%. without additional wall motion abnormality. ? ?Coronary angiogram 06/07/2018: ?Right dominant circulation, severely diffusely calcified vessels, previously placed stents in the proximal and mid RCA are widely patent with very mild neointimal hyperplasia in the proximal segment.  LAD is diffusely diseased and there is a focal 70 to 75% stenosis which is probably significant by FFR, however the LAD is diffusely diseased.  Lesion does not suggest unstable lesion and is heavily calcified.  Ramus and circumflex are moderately diffusely diseased and calcified without luminal high-grade stenosis.  Normal LV systolic function, normal  LVEDP. ? ?(right coronary artery (3.0 x 18 mm Cypher drug-eluting stent on 05/16/2003 patent). ? ?Carotid artery duplex 08/18/2020: ?Duplex suggests stenosis in the right internal carotid artery (16-49%). Duplex suggests

## 2021-05-20 ENCOUNTER — Ambulatory Visit: Payer: BC Managed Care – PPO

## 2021-05-20 DIAGNOSIS — I25118 Atherosclerotic heart disease of native coronary artery with other forms of angina pectoris: Secondary | ICD-10-CM | POA: Diagnosis not present

## 2021-05-20 DIAGNOSIS — I447 Left bundle-branch block, unspecified: Secondary | ICD-10-CM | POA: Diagnosis not present

## 2021-05-27 ENCOUNTER — Encounter: Payer: Self-pay | Admitting: Cardiology

## 2021-05-27 NOTE — Telephone Encounter (Signed)
From patient.

## 2021-05-31 ENCOUNTER — Encounter: Payer: Self-pay | Admitting: Cardiology

## 2021-05-31 DIAGNOSIS — E7800 Pure hypercholesterolemia, unspecified: Secondary | ICD-10-CM | POA: Diagnosis not present

## 2021-05-31 DIAGNOSIS — I10 Essential (primary) hypertension: Secondary | ICD-10-CM | POA: Diagnosis not present

## 2021-05-31 DIAGNOSIS — R739 Hyperglycemia, unspecified: Secondary | ICD-10-CM | POA: Diagnosis not present

## 2021-05-31 NOTE — Telephone Encounter (Signed)
From patient.

## 2021-06-01 LAB — CMP14+EGFR
ALT: 16 IU/L (ref 0–32)
AST: 19 IU/L (ref 0–40)
Albumin/Globulin Ratio: 1.9 (ref 1.2–2.2)
Albumin: 4.9 g/dL — ABNORMAL HIGH (ref 3.8–4.8)
Alkaline Phosphatase: 82 IU/L (ref 44–121)
BUN/Creatinine Ratio: 13 (ref 12–28)
BUN: 11 mg/dL (ref 8–27)
Bilirubin Total: 0.4 mg/dL (ref 0.0–1.2)
CO2: 25 mmol/L (ref 20–29)
Calcium: 10.2 mg/dL (ref 8.7–10.3)
Chloride: 99 mmol/L (ref 96–106)
Creatinine, Ser: 0.88 mg/dL (ref 0.57–1.00)
Globulin, Total: 2.6 g/dL (ref 1.5–4.5)
Glucose: 119 mg/dL — ABNORMAL HIGH (ref 70–99)
Potassium: 4.4 mmol/L (ref 3.5–5.2)
Sodium: 139 mmol/L (ref 134–144)
Total Protein: 7.5 g/dL (ref 6.0–8.5)
eGFR: 73 mL/min/{1.73_m2} (ref >59)

## 2021-06-01 LAB — LIPID PANEL WITH LDL/HDL RATIO
Cholesterol, Total: 173 mg/dL (ref 100–199)
HDL: 55 mg/dL (ref >39)
LDL Chol Calc (NIH): 99 mg/dL (ref 0–99)
LDL/HDL Ratio: 1.8 ratio (ref 0.0–3.2)
Triglycerides: 105 mg/dL (ref 0–149)
VLDL Cholesterol Cal: 19 mg/dL (ref 5–40)

## 2021-06-01 LAB — HGB A1C W/O EAG: Hgb A1c MFr Bld: 6.1 % — ABNORMAL HIGH (ref 4.8–5.6)

## 2021-06-01 NOTE — Telephone Encounter (Signed)
From patient.

## 2021-06-07 ENCOUNTER — Other Ambulatory Visit: Payer: Self-pay

## 2021-06-07 ENCOUNTER — Other Ambulatory Visit: Payer: Self-pay | Admitting: Cardiology

## 2021-06-07 MED ORDER — NITROGLYCERIN 0.4 MG SL SUBL: 0.4000 mg | SUBLINGUAL_TABLET | SUBLINGUAL | 1 refills | Status: AC | PRN

## 2021-08-05 ENCOUNTER — Encounter: Payer: Self-pay | Admitting: Cardiology

## 2021-08-16 ENCOUNTER — Ambulatory Visit: Payer: BC Managed Care – PPO | Admitting: Cardiology

## 2021-08-16 DIAGNOSIS — H53453 Other localized visual field defect, bilateral: Secondary | ICD-10-CM | POA: Diagnosis not present

## 2021-08-16 DIAGNOSIS — H02413 Mechanical ptosis of bilateral eyelids: Secondary | ICD-10-CM | POA: Diagnosis not present

## 2021-08-16 DIAGNOSIS — H02834 Dermatochalasis of left upper eyelid: Secondary | ICD-10-CM | POA: Diagnosis not present

## 2021-08-16 DIAGNOSIS — H02831 Dermatochalasis of right upper eyelid: Secondary | ICD-10-CM | POA: Diagnosis not present

## 2021-08-20 ENCOUNTER — Ambulatory Visit: Payer: BC Managed Care – PPO

## 2021-08-20 DIAGNOSIS — I6523 Occlusion and stenosis of bilateral carotid arteries: Secondary | ICD-10-CM | POA: Diagnosis not present

## 2021-08-31 ENCOUNTER — Ambulatory Visit: Payer: BC Managed Care – PPO | Admitting: Cardiology

## 2021-08-31 ENCOUNTER — Encounter: Payer: Self-pay | Admitting: Cardiology

## 2021-08-31 VITALS — BP 120/70 | HR 66 | Temp 97.6°F | Resp 16 | Ht 63.0 in | Wt 161.0 lb

## 2021-08-31 DIAGNOSIS — I10 Essential (primary) hypertension: Secondary | ICD-10-CM | POA: Diagnosis not present

## 2021-08-31 DIAGNOSIS — I6523 Occlusion and stenosis of bilateral carotid arteries: Secondary | ICD-10-CM | POA: Diagnosis not present

## 2021-08-31 DIAGNOSIS — I25118 Atherosclerotic heart disease of native coronary artery with other forms of angina pectoris: Secondary | ICD-10-CM

## 2021-08-31 DIAGNOSIS — F411 Generalized anxiety disorder: Secondary | ICD-10-CM

## 2021-08-31 DIAGNOSIS — E7800 Pure hypercholesterolemia, unspecified: Secondary | ICD-10-CM | POA: Diagnosis not present

## 2021-08-31 MED ORDER — ALPRAZOLAM 0.25 MG PO TABS: 0.2500 mg | ORAL_TABLET | Freq: Every day | ORAL | 0 refills | Status: AC | PRN

## 2021-08-31 NOTE — Progress Notes (Signed)
Primary Physician/Referring:  Marden Noble, MD  Patient ID: Tiffany Ryan, female    DOB: November 03, 1955, 66 y.o.   MRN: [id number]  Chief Complaint  Patient presents with   LBBB   Hypertension   Hyperlipidemia   Follow-up    3 MONTHS   HPI:    Tiffany Ryan  is a 66 y.o. Caucasian female with hypertension, hyperglycemia,family history of premature coronary artery disease, multiple medication intolerances, hyperlipidemia, CAD S/P 3.0 x 18 mm Cypher drug-eluting stent to mid RCA placed on 05/16/2003. Coronary angiography on 06/07/2018 revealing severe heavy calcification in all 3 coronary vessels with patent RCA stent and has a 70% to 75% mid to distal LAD stenosis, probably physiologically significant but was recommended medical therapy in view of diffuse calcification and uncontrolled hyperlipidemia.  She presents for 3 month visit, he now presents for follow-up of hypertension and hyperlipidemia, could not tolerate carvedilol due to abdominal discomfort and continued valsartan due to headache and had switched her to Aldactazide which she states that she did not feel well and has gone back to taking valsartan but at reduced dose of 80 mg.  She brings home blood pressure recordings which are under excellent control.  No chest pain.  Denies dyspnea, leg edema.  Past Medical History:  Diagnosis Date   CAD (coronary artery disease), native coronary artery 01/20/2019   COVID-19    High cholesterol    Hyperlipemia 08/06/2018   Hypertension    Unstable angina (HCC) 03/06/2016    Family History  Problem Relation Age of Onset   Stroke Father     Social History   Tobacco Use   Smoking status: Former    Packs/day: 1.00    Years: 25.00    Total pack years: 25.00    Types: Cigarettes    Quit date: 2006    Years since quitting: 17.6   Smokeless tobacco: Never  Substance Use Topics   Alcohol use: Yes    Alcohol/week: 1.0 standard drink of alcohol    Types: 1 Glasses of wine per week     Comment: OCCASSIONAL   Marital Status: Married  ROS  Review of Systems  Cardiovascular:  Negative for chest pain, dyspnea on exertion and leg swelling.  Musculoskeletal:  Positive for arthritis.  Gastrointestinal:  Negative for melena.  Psychiatric/Behavioral:  Positive for depression. The patient is nervous/anxious.    Objective  Blood pressure 120/70, pulse 66, temperature 97.6 F (36.4 C), temperature source Temporal, resp. rate 16, height 5\' 3"  (1.6 m), weight 161 lb (73 kg), SpO2 97 %.     08/31/2021    3:43 PM 08/31/2021    3:12 PM 05/19/2021   11:21 AM  Vitals with BMI  Height  5\' 3"  5\' 3"   Weight  161 lbs 160 lbs 3 oz  BMI  28.53 28.39  Systolic 120 140 05/21/2021  Diastolic 70 73 83  Pulse  66 81     Physical Exam Vitals reviewed.  Constitutional:      Appearance: She is well-developed.  Neck:     Vascular: No JVD.  Cardiovascular:     Rate and Rhythm: Normal rate and regular rhythm.     Pulses: Intact distal pulses.          Carotid pulses are  on the left side with bruit.      Femoral pulses are 2+ on the right side and 2+ on the left side.      Popliteal pulses are 2+ on the right  side and 2+ on the left side.       Dorsalis pedis pulses are 1+ on the right side and 1+ on the left side.       Posterior tibial pulses are 1+ on the right side and 1+ on the left side.     Heart sounds: Normal heart sounds.  Pulmonary:     Effort: Pulmonary effort is normal. No accessory muscle usage or respiratory distress.     Breath sounds: Normal breath sounds.  Abdominal:     General: Bowel sounds are normal.     Palpations: Abdomen is soft.  Musculoskeletal:     Right lower leg: No edema.     Left lower leg: No edema.    Laboratory examination:   Recent Labs    05/31/21 0800  NA 139  K 4.4  CL 99  CO2 25  GLUCOSE 119*  BUN 11  CREATININE 0.88  CALCIUM 10.2   CrCl cannot be calculated (Patient's most recent lab result is older than the maximum 21 days allowed.).      Latest Ref Rng & Units 05/31/2021    8:00 AM 08/22/2020    1:43 AM 06/19/2020    8:34 AM  CMP  Glucose 70 - 99 mg/dL 782  423  536   BUN 8 - 27 mg/dL 11  17  13    Creatinine 0.57 - 1.00 mg/dL  1.44  3.15   Sodium 134 - 144 mmol/L 139  137  141   Potassium 3.5 - 5.2 mmol/L 4.4  4.3  4.8   Chloride 96 - 106 mmol/L 99  104  103   CO2 20 - 29 mmol/L 25  26  26    Calcium 8.7 - 10.3 mg/dL 4.00  9.7  9.8   Total Protein 6.0 - 8.5 g/dL 7.5  6.9  6.9   Total Bilirubin 0.0 - 1.2 mg/dL 0.4  0.2  0.4   Alkaline Phos 44 - 121 IU/L 82  57  65   AST 0 - 40 IU/L 19  19  13    ALT 0 - 32 IU/L 16  13  13        Latest Ref Rng & Units 08/22/2020    1:43 AM 02/23/2019    4:12 PM 06/07/2018    5:09 AM  CBC  WBC 4.0 - 10.5 K/uL 7.2  5.3  7.0   Hemoglobin 12.0 - 15.0 g/dL  08/24/2020  02/25/2019   Hematocrit 36.0 - 46.0 % 41.0  43.9  37.5   Platelets 150 - 400 K/uL 301  290  299     Lipid Panel Recent Labs    05/31/21 0800  CHOL 173  TRIG 105  LDLCALC 99  HDL 55   HEMOGLOBIN A1C Lab Results  Component Value Date   HGBA1C 6.1 (H) 05/31/2021   MPG 131.24 06/06/2018   External Labs: TSH 2.150 10/31/2018  Medications and allergies   Allergies  Allergen Reactions   Ramipril Cough   Carvedilol Other (See Comments)    Neck pain   Valsartan Other (See Comments)    Bloating   Amlodipine Other (See Comments)    Headache   Repatha [Evolocumab] Other (See Comments)    Neck tightness     Current Outpatient Medications:    ALPRAZolam (XANAX) 0.25 MG tablet, Take 1 tablet (0.25 mg total) by mouth daily as needed for anxiety., Disp: 30 tablet, Rfl: 0   aspirin 81 MG tablet, Take 81 mg by  mouth daily., Disp: , Rfl:    Cholecalciferol (VITAMIN D) 2000 units CAPS, Take 2,000 Units by mouth daily., Disp: , Rfl:    CRESTOR 20 MG tablet, TAKE 1/2 TABLET BY MOUTH DAILY, Disp: 90 tablet, Rfl: 3   ezetimibe (ZETIA) 10 MG tablet, Take 1 tablet (10 mg total) by mouth daily., Disp: 90 tablet, Rfl: 3    ibuprofen (ADVIL,MOTRIN) 200 MG tablet, Take 200 mg by mouth every 6 (six) hours as needed for moderate pain., Disp: , Rfl:    magnesium 30 MG tablet, Take 30 mg by mouth once a week., Disp: , Rfl:    nitroGLYCERIN (NITROSTAT) 0.4 MG SL tablet, Place 1 tablet (0.4 mg total) under the tongue every 5 (five) minutes as needed for chest pain., Disp: 30 tablet, Rfl: 1   omeprazole (PRILOSEC) 20 MG capsule, Take 1 capsule (20 mg total) by mouth 2 (two) times daily before a meal. (Patient taking differently: Take 20 mg by mouth daily as needed.), Disp: 28 capsule, Rfl: 0   ranolazine (RANEXA) 500 MG 12 hr tablet, TAKE 1 TABLET(500 MG) BY MOUTH TWICE DAILY AS NEEDED, Disp: 180 tablet, Rfl: 1   valsartan (DIOVAN) 80 MG tablet, Take 80 mg by mouth daily., Disp: , Rfl:    spironolactone-hydrochlorothiazide (ALDACTAZIDE) 25-25 MG tablet, Take 1 tablet by mouth every morning. (Patient not taking: Reported on 08/31/2021), Disp: 90 tablet, Rfl: 3  Radiology:   No results found.  Cardiac Studies:   Stress test 03/08/2015: Patient exercised on Bruce protocol for 9 minutes and 28 seconds and achieved 106% of MPHR.  Occasional PVCs during stress and recovery period, underlying LBBB.Marland Kitchen  Normal blood pressure response.  Poststress LVEF 60%. without additional wall motion abnormality.  Coronary angiogram 06/07/2018: Right dominant circulation, severely diffusely calcified vessels, previously placed stents in the proximal and mid RCA are widely patent with very mild neointimal hyperplasia in the proximal segment.  LAD is diffusely diseased and there is a focal 70 to 75% stenosis which is probably significant by FFR, however the LAD is diffusely diseased.  Lesion does not suggest unstable lesion and is heavily calcified.  Ramus and circumflex are moderately diffusely diseased and calcified without luminal high-grade stenosis.  Normal LV systolic function, normal LVEDP.  (right coronary artery (3.0 x 18 mm Cypher drug-eluting  stent on 05/16/2003 patent).   Carotid artery duplex 08/20/2021: Duplex suggests stenosis in the right internal carotid artery (1-15%). Duplex suggests stenosis in the right external carotid artery (<50%). Duplex suggests stenosis in the left internal carotid artery (1-15%). Duplex suggests stenosis in the left external carotid artery (<50%). Antegrade right vertebral artery flow. Antegrade left vertebral artery flow. Compared to the study done on 08/18/2020, mild bilateral ICA stenosis not present, no change in external carotid artery stenosis.  Follow-up studies if clinically indicated.  PCV ECHOCARDIOGRAM COMPLETE 05/20/2021  Narrative Echocardiogram 05/20/2021: Left ventricle cavity is normal in size and wall thickness. Normal global wall motion. Normal LV systolic function with visual EF 50-55%. Abnormal septal wall motion due to left bundle branch block. Moderate (Grade II) mitral regurgitation. Mild pulmonic regurgitation. Unable to calculate RV systolic pressure due to inadequate TR jet with trace tricupsid reguegitation.     EKG  EKG 05/19/2021: Normal sinus rhythm with rate of 71 bpm, leftward enlargement, left bundle branch block.  No further analysis.  No change from prior EKG    Assessment     ICD-10-CM   1. Atherosclerosis of native coronary artery of native heart with  stable angina pectoris (HCC)  I25.118     2. Asymptomatic bilateral carotid artery stenosis  I65.23     3. Primary hypertension  I10     4. Hypercholesteremia  E78.00     5. Generalized anxiety disorder  F41.1 ALPRAZolam (XANAX) 0.25 MG tablet      Meds ordered this encounter  Medications   ALPRAZolam (XANAX) 0.25 MG tablet    Sig: Take 1 tablet (0.25 mg total) by mouth daily as needed for anxiety.    Dispense:  30 tablet    Refill:  0    Recommendations:   Tiffany Ryan  is a 66 y.o. Caucasian female with hypertension, hyperglycemia,family history of premature coronary artery disease, multiple  medication intolerances, hyperlipidemia, CAD S/P 3.0 x 18 mm Cypher drug-eluting stent to mid RCA placed on 05/16/2003. Coronary angiography on 06/07/2018 revealing severe heavy calcification in all 3 coronary vessels with patent RCA stent and has a 70% to 75% mid to distal LAD stenosis, probably physiologically significant but was recommended medical therapy in view of diffuse calcification and uncontrolled hyperlipidemia.  She presents for 3 month visit, he now presents for follow-up of hypertension and hyperlipidemia, could not tolerate carvedilol due to abdominal discomfort and continued valsartan due to headache and had switched her to Aldactazide which she states that she did not feel well and has gone back to taking valsartan but at reduced dose of 80 mg.  She brings home blood pressure recordings which are under excellent control.  She has not had any further chest pain, overall feels well.  She just returned from a trip abroad to [place] her native country and has gained about 10 pounds in weight and she will work on weight loss.  No clinical evidence of heart failure.  I reviewed the results of the echocardiogram, low normal LVEF in spite of left bundle branch block.  Carotid artery duplex reviewed, very mild disease in the internal carotid artery.  No further evaluation is indicated except for continuing primary/secondary prevention.   She does not want to try any other statins except for Crestor 10 mg. LDL is not at goal.  She does not want to try injectables for lipid management.  Office visit in a year or sooner if problems.   Yates Decamp, MD, [facility] 08/31/2021, 10:39 PM Office: [phone] Fax: [phone] Pager: [phone]

## 2021-09-01 ENCOUNTER — Other Ambulatory Visit: Payer: Self-pay | Admitting: Internal Medicine

## 2021-09-01 DIAGNOSIS — Z1231 Encounter for screening mammogram for malignant neoplasm of breast: Secondary | ICD-10-CM

## 2021-09-07 DIAGNOSIS — R1030 Lower abdominal pain, unspecified: Secondary | ICD-10-CM | POA: Diagnosis not present

## 2021-09-07 DIAGNOSIS — R399 Unspecified symptoms and signs involving the genitourinary system: Secondary | ICD-10-CM | POA: Diagnosis not present

## 2021-09-13 ENCOUNTER — Other Ambulatory Visit: Payer: Self-pay | Admitting: Internal Medicine

## 2021-09-13 DIAGNOSIS — R109 Unspecified abdominal pain: Secondary | ICD-10-CM

## 2021-09-14 ENCOUNTER — Ambulatory Visit
Admission: RE | Admit: 2021-09-14 | Discharge: 2021-09-14 | Disposition: A | Discharge status: Home / Self Care | Payer: BC Managed Care – PPO | Source: Ambulatory Visit | Attending: Internal Medicine | Admitting: Internal Medicine

## 2021-09-14 DIAGNOSIS — Z1231 Encounter for screening mammogram for malignant neoplasm of breast: Secondary | ICD-10-CM

## 2021-09-22 ENCOUNTER — Other Ambulatory Visit: Payer: Self-pay | Admitting: Cardiology

## 2021-09-23 ENCOUNTER — Ambulatory Visit
Admission: RE | Admit: 2021-09-23 | Discharge: 2021-09-23 | Disposition: A | Discharge status: Home / Self Care | Payer: BC Managed Care – PPO | Source: Ambulatory Visit | Attending: Internal Medicine | Admitting: Internal Medicine

## 2021-09-23 DIAGNOSIS — R109 Unspecified abdominal pain: Secondary | ICD-10-CM | POA: Diagnosis not present

## 2021-09-23 MED ORDER — IOPAMIDOL (ISOVUE-300) INJECTION 61%
100.0000 mL | Freq: Once | INTRAVENOUS | Status: AC | PRN
  Administered 2021-09-23: 100 mL via INTRAVENOUS

## 2021-10-28 ENCOUNTER — Other Ambulatory Visit: Payer: Self-pay | Admitting: Cardiology

## 2021-10-28 ENCOUNTER — Other Ambulatory Visit: Payer: Self-pay

## 2021-10-28 MED ORDER — VALSARTAN 80 MG PO TABS: 80.0000 mg | ORAL_TABLET | Freq: Every day | ORAL | 0 refills | Status: DC

## 2021-11-23 DIAGNOSIS — E559 Vitamin D deficiency, unspecified: Secondary | ICD-10-CM | POA: Diagnosis not present

## 2021-11-23 DIAGNOSIS — Z0000 Encounter for general adult medical examination without abnormal findings: Secondary | ICD-10-CM | POA: Diagnosis not present

## 2021-11-23 DIAGNOSIS — Z79899 Other long term (current) drug therapy: Secondary | ICD-10-CM | POA: Diagnosis not present

## 2021-11-23 DIAGNOSIS — E7800 Pure hypercholesterolemia, unspecified: Secondary | ICD-10-CM | POA: Diagnosis not present

## 2021-11-23 DIAGNOSIS — K219 Gastro-esophageal reflux disease without esophagitis: Secondary | ICD-10-CM | POA: Diagnosis not present

## 2021-11-23 DIAGNOSIS — I2510 Atherosclerotic heart disease of native coronary artery without angina pectoris: Secondary | ICD-10-CM | POA: Diagnosis not present

## 2021-12-18 ENCOUNTER — Other Ambulatory Visit: Payer: Self-pay | Admitting: Cardiology

## 2022-01-10 DIAGNOSIS — E785 Hyperlipidemia, unspecified: Secondary | ICD-10-CM | POA: Diagnosis not present

## 2022-01-10 DIAGNOSIS — K219 Gastro-esophageal reflux disease without esophagitis: Secondary | ICD-10-CM | POA: Diagnosis not present

## 2022-01-10 DIAGNOSIS — I209 Angina pectoris, unspecified: Secondary | ICD-10-CM | POA: Diagnosis not present

## 2022-01-10 DIAGNOSIS — I10 Essential (primary) hypertension: Secondary | ICD-10-CM | POA: Diagnosis not present

## 2022-01-10 DIAGNOSIS — Z87891 Personal history of nicotine dependence: Secondary | ICD-10-CM | POA: Diagnosis not present

## 2022-01-10 DIAGNOSIS — Z008 Encounter for other general examination: Secondary | ICD-10-CM | POA: Diagnosis not present

## 2022-01-10 DIAGNOSIS — I739 Peripheral vascular disease, unspecified: Secondary | ICD-10-CM | POA: Diagnosis not present

## 2022-01-25 DIAGNOSIS — Z79899 Other long term (current) drug therapy: Secondary | ICD-10-CM | POA: Diagnosis not present

## 2022-02-10 DIAGNOSIS — I739 Peripheral vascular disease, unspecified: Secondary | ICD-10-CM | POA: Diagnosis not present

## 2022-02-22 ENCOUNTER — Ambulatory Visit (HOSPITAL_COMMUNITY)
Admission: RE | Admit: 2022-02-22 | Discharge: 2022-02-22 | Disposition: A | Discharge status: Home / Self Care | Payer: Medicare HMO | Source: Ambulatory Visit | Attending: Vascular Surgery | Admitting: Vascular Surgery

## 2022-02-22 ENCOUNTER — Other Ambulatory Visit (HOSPITAL_COMMUNITY): Payer: Self-pay | Admitting: Internal Medicine

## 2022-02-22 ENCOUNTER — Encounter (HOSPITAL_COMMUNITY): Payer: Self-pay

## 2022-02-22 DIAGNOSIS — I739 Peripheral vascular disease, unspecified: Secondary | ICD-10-CM | POA: Diagnosis present

## 2022-02-22 LAB — VAS US ABI WITH/WO TBI
Left ABI: 1.14
Right ABI: 1.06

## 2022-03-28 ENCOUNTER — Encounter: Payer: Self-pay | Admitting: Surgery

## 2022-03-28 ENCOUNTER — Ambulatory Visit: Payer: Medicare HMO | Admitting: Surgery

## 2022-03-28 VITALS — BP 145/85 | HR 81 | Temp 97.8°F | Resp 20 | Ht 63.0 in | Wt 157.0 lb

## 2022-03-28 DIAGNOSIS — I739 Peripheral vascular disease, unspecified: Secondary | ICD-10-CM

## 2022-03-28 NOTE — Progress Notes (Signed)
[facility]  Patient name: Tiffany Ryan MRN: [id number] DOB: Feb 13, 1955 Sex: female   REQUESTING PROVIDER:    Rochele Raring   REASON FOR CONSULT:    PAD  HISTORY OF PRESENT ILLNESS:   Tiffany Ryan is a 67 y.o. female, who has a history of coronary artery disease, status post PCI.  She most recently underwent cardiac catheterization in 2020 with severe heavily calcified three-vessel disease, which was treated medically.  She has uncontrolled hypertension statin for hypercholesterolemia.  She has a significant smoking history in the past.  She underwent a insurance screening exam which was abnormal and show she was sent for formal testing and evaluation.  She denies any symptoms of claudication.  She does not have rest pain.  She does not have any open wounds.  PAST MEDICAL HISTORY    Past Medical History:  Diagnosis Date   CAD (coronary artery disease), native coronary artery 01/20/2019   COVID-19    High cholesterol    Hyperlipemia 08/06/2018   Hypertension    Unstable angina ([place]) 03/06/2016     FAMILY HISTORY   Family History  Problem Relation Age of Onset   Stroke Father     SOCIAL HISTORY:   Social History   Socioeconomic History   Marital status: Married    Spouse name: Not on file   Number of children: 2   Years of education: Not on file   Highest education level: Not on file  Occupational History   Not on file  Tobacco Use   Smoking status: Former    Packs/day: 1.00    Years: 25.00    Total pack years: 25.00    Types: Cigarettes    Quit date: 2006    Years since quitting: 18.1   Smokeless tobacco: Never  Vaping Use   Vaping Use: Never used  Substance and Sexual Activity   Alcohol use: Yes    Alcohol/week: 1.0 standard drink of alcohol    Types: 1 Glasses of wine per week    Comment: OCCASSIONAL   Drug use: No   Sexual activity: Not on file  Other Topics Concern   Not on file   Social History Narrative   Not on file   Social Determinants of Health   Financial Resource Strain: Not on file  Food Insecurity: Not on file  Transportation Needs: Not on file  Physical Activity: Not on file  Stress: Not on file  Social Connections: Not on file  Intimate Partner Violence: Not on file    ALLERGIES:    Allergies  Allergen Reactions   Ramipril Cough   Carvedilol Other (See Comments)    Neck pain   Valsartan Other (See Comments)    Bloating   Amlodipine Other (See Comments)    Headache   Repatha [Evolocumab] Other (See Comments)    Neck tightness    CURRENT MEDICATIONS:    Current Outpatient Medications  Medication Sig Dispense Refill   ALPRAZolam (XANAX) 0.25 MG tablet Take 1 tablet (0.25 mg total) by mouth daily as needed for anxiety. 30 tablet 0   aspirin 81 MG tablet Take 81 mg by mouth daily.     Cholecalciferol (VITAMIN D) 2000 units CAPS Take 2,000 Units by mouth daily.     CRESTOR 20 MG tablet TAKE 1/2 TABLET BY MOUTH DAILY 90 tablet 3   ezetimibe (ZETIA) 10 MG tablet Take 1 tablet (10 mg total) by mouth daily. 90 tablet 3   ibuprofen (ADVIL,MOTRIN)  200 MG tablet Take 200 mg by mouth every 6 (six) hours as needed for moderate pain.     magnesium 30 MG tablet Take 30 mg by mouth once a week.     nitroGLYCERIN (NITROSTAT) 0.4 MG SL tablet Place 1 tablet (0.4 mg total) under the tongue every 5 (five) minutes as needed for chest pain. 30 tablet 1   omeprazole (PRILOSEC) 20 MG capsule Take 1 capsule (20 mg total) by mouth 2 (two) times daily before a meal. (Patient taking differently: Take 20 mg by mouth daily as needed.) 28 capsule 0   ranolazine (RANEXA) 500 MG 12 hr tablet TAKE 1 TABLET(500 MG) BY MOUTH TWICE DAILY AS NEEDED 180 tablet 1   spironolactone-hydrochlorothiazide (ALDACTAZIDE) 25-25 MG tablet Take 1 tablet by mouth every morning. 90 tablet 3   valsartan (DIOVAN) 80 MG tablet TAKE 1 TABLET(80 MG) BY MOUTH DAILY 30 tablet 0   No current  facility-administered medications for this visit.    REVIEW OF SYSTEMS:   '[X]'$  denotes positive finding, '[ ]'$  denotes negative finding Cardiac  Comments:  Chest pain or chest pressure:    Shortness of breath upon exertion:    Short of breath when lying flat:    Irregular heart rhythm:        Vascular    Pain in calf, thigh, or hip brought on by ambulation:    Pain in feet at night that wakes you up from your sleep:     Blood clot in your veins:    Leg swelling:         Pulmonary    Oxygen at home:    Productive cough:     Wheezing:         Neurologic    Sudden weakness in arms or legs:     Sudden numbness in arms or legs:     Sudden onset of difficulty speaking or slurred speech:    Temporary loss of vision in one eye:     Problems with dizziness:         Gastrointestinal    Blood in stool:      Vomited blood:         Genitourinary    Burning when urinating:     Blood in urine:        Psychiatric    Major depression:         Hematologic    Bleeding problems:    Problems with blood clotting too easily:        Skin    Rashes or ulcers:        Constitutional    Fever or chills:     PHYSICAL EXAM:   Vitals:   03/28/22 1044  BP: (!) 145/85  Pulse: 81  Resp: 20  Temp: 97.8 F (36.6 C)  SpO2: 97%  Weight: 157 lb (71.2 kg)  Height: '5\' 3"'$  (1.6 m)    GENERAL: The patient is a well-nourished female, in no acute distress. The vital signs are documented above. CARDIAC: There is a regular rate and rhythm.  VASCULAR: Faintly palpable dorsalis pedis pulses bilaterally.  No significant edema. PULMONARY: Nonlabored respirations MUSCULOSKELETAL: There are no major deformities or cyanosis. NEUROLOGIC: No focal weakness or paresthesias are detected. SKIN: There are no ulcers or rashes noted. PSYCHIATRIC: The patient has a normal affect.  STUDIES:   I have reviewed the following studies: ABI/TBIToday's ABIToday's TBIPrevious ABIPrevious TBI   +-------+-----------+-----------+------------+------------+  Right 1.06  0.49                                 +-------+-----------+-----------+------------+------------+  Left  1.14       0.41                                 +-------+-----------+-----------+------------+------------+  Right toe pressure: 89 Left toe pressure: 74 Pedal waveforms are triphasic  ASSESSMENT and PLAN   PAD: The patient has normal ABIs with triphasic waveforms.  She is without symptoms.  Her toe pressures are slightly diminished.  She is already on optimal medical treatment because of her underlying cardiac disease.  No medication changes are recommended.  I discussed with her that if she develops a nonhealing wound that she needs to have this addressed immediately.  Otherwise, I will see her back on an as-needed basis.  I also told her to discuss with Dr. Einar Gip if she has any issues since she sees them routinely for cardiac issues.   Leia Alf, MD, FACS [facility] [phone] Pager [phone]

## 2022-03-31 ENCOUNTER — Other Ambulatory Visit: Payer: Self-pay | Admitting: Cardiology

## 2022-04-05 DIAGNOSIS — R531 Weakness: Secondary | ICD-10-CM | POA: Diagnosis not present

## 2022-04-05 DIAGNOSIS — R0981 Nasal congestion: Secondary | ICD-10-CM | POA: Diagnosis not present

## 2022-04-28 ENCOUNTER — Other Ambulatory Visit: Payer: Self-pay

## 2022-05-16 ENCOUNTER — Ambulatory Visit: Payer: Medicare HMO | Admitting: Cardiology

## 2022-05-16 ENCOUNTER — Telehealth: Payer: Self-pay

## 2022-05-16 ENCOUNTER — Encounter: Payer: Self-pay | Admitting: Cardiology

## 2022-05-16 VITALS — BP 186/102 | HR 100 | Ht 63.0 in | Wt 157.6 lb

## 2022-05-16 DIAGNOSIS — I25118 Atherosclerotic heart disease of native coronary artery with other forms of angina pectoris: Secondary | ICD-10-CM | POA: Diagnosis not present

## 2022-05-16 DIAGNOSIS — I10 Essential (primary) hypertension: Secondary | ICD-10-CM | POA: Diagnosis not present

## 2022-05-16 DIAGNOSIS — E7800 Pure hypercholesterolemia, unspecified: Secondary | ICD-10-CM

## 2022-05-16 DIAGNOSIS — I447 Left bundle-branch block, unspecified: Secondary | ICD-10-CM

## 2022-05-16 DIAGNOSIS — R0789 Other chest pain: Secondary | ICD-10-CM

## 2022-05-16 MED ORDER — ROSUVASTATIN CALCIUM 40 MG PO TABS: 20.0000 mg | ORAL_TABLET | Freq: Every day | ORAL | 3 refills | Status: AC

## 2022-05-16 MED ORDER — NEBIVOLOL HCL 10 MG PO TABS: 10.0000 mg | ORAL_TABLET | Freq: Every day | ORAL | 2 refills | Status: DC

## 2022-05-16 NOTE — Telephone Encounter (Signed)
Chest Pain  What were you doing when it began? She says she wasn't doing anything when it began but movement makes it worse. Is there anything that relieves the pain? no Can you describe the pain? (Ex. Sharp, dull) dull constant pain, heaviness on chest, burning sensation on breast bone. Where does it hurt? Chest and left upper body (arm, shoulder, neck)  Does it radiate or move across your chest, shoulder, or down your arm? yes On a scale of 1-10, how bad is the pain? 7 When did it start and/ or how long has it lasted? 2 weeks ago and has been on and off.  Do you have a prescription for Nitroglycerin? Yes, but has not gotten it from pharmacy If yes, have you taken any today? N/a  Have you taken any erectile dysfunction medications? N/a If yes, do NOT take Nitroglycerin.  If patient has not taken any erectile dysfunction medications you can instruct patient to take as directed.

## 2022-05-16 NOTE — Progress Notes (Signed)
Primary Physician/Referring:  Thana Ates, MD  Patient ID: Tiffany Ryan, female    DOB: 1955-02-14, 67 y.o.   MRN: [id number]  Chief Complaint  Patient presents with   Atherosclerosis of native coronary artery of native heart w   Follow-up    Sob, left shoulder pain   HPI:    Tiffany Ryan  is a 67 y.o. Caucasian female with hypertension, hyperglycemia,family history of premature coronary artery disease, multiple medication intolerances, hyperlipidemia, CAD S/P 3.0 x 18 mm Cypher drug-eluting stent to mid RCA placed on 05/16/2003. Coronary angiography on 06/07/2018 revealing severe heavy calcification in all 3 coronary vessels with patent RCA stent and has a 70% to 75% mid to distal LAD stenosis, probably physiologically significant but was recommended medical therapy in view of diffuse calcification and uncontrolled hyperlipidemia.  Patient Venastat appointment stating that she has been having severe chest pain, was extremely emotional and started to cry in our office.  States that chest pain has been going on for last several days.  Sharp in nature and in the middle of the chest.  Feels tired and fatigued.  No other associated symptoms.  Worse on taking deep breath.  No leg edema, no hemoptysis, no dyspnea   Past Medical History:  Diagnosis Date   CAD (coronary artery disease), native coronary artery 01/20/2019   COVID-19    High cholesterol    Hyperlipemia 08/06/2018   Hypertension    Unstable angina 03/06/2016    Family History  Problem Relation Age of Onset   Stroke Father    Cancer - Lung Sister     Social History   Tobacco Use   Smoking status: Former    Packs/day: 1.00    Years: 25.00    Additional pack years: 0.00    Total pack years: 25.00    Types: Cigarettes    Quit date: 2006    Years since quitting: 18.3   Smokeless tobacco: Never  Substance Use Topics   Alcohol use: Yes    Alcohol/week: 1.0 standard drink of alcohol    Types: 1 Glasses of wine per week     Comment: OCCASSIONAL   Marital Status: Married  ROS  Review of Systems  Cardiovascular:  Positive for chest pain. Negative for dyspnea on exertion and leg swelling.  Psychiatric/Behavioral:  The patient is nervous/anxious.    Objective  Blood pressure (!) 186/102, pulse 100, height 5\' 3"  (1.6 m), weight 157 lb 9.6 oz (71.5 kg), SpO2 98 %.     05/16/2022    3:25 PM 03/28/2022   10:44 AM 08/31/2021    3:43 PM  Vitals with BMI  Height 5\' 3"  5\' 3"    Weight 157 lbs 10 oz 157 lbs   BMI 27.92 27.82   Systolic 186 145 409  Diastolic 102 85 70  Pulse 100 81      Physical Exam Vitals reviewed.  Constitutional:      Appearance: She is well-developed.  Neck:     Vascular: No JVD.  Cardiovascular:     Rate and Rhythm: Normal rate and regular rhythm.     Pulses: Intact distal pulses.          Carotid pulses are  on the left side with bruit.      Femoral pulses are 2+ on the right side and 2+ on the left side.      Popliteal pulses are 2+ on the right side and 2+ on the left side.  Dorsalis pedis pulses are 1+ on the right side and 1+ on the left side.       Posterior tibial pulses are 1+ on the right side and 1+ on the left side.     Heart sounds: Normal heart sounds.  Pulmonary:     Effort: Pulmonary effort is normal. No accessory muscle usage or respiratory distress.     Breath sounds: Normal breath sounds.  Abdominal:     General: Bowel sounds are normal.     Palpations: Abdomen is soft.  Musculoskeletal:     Right lower leg: No edema.     Left lower leg: No edema.    Laboratory examination:   Recent Labs    05/31/21 0800  NA 139  K 4.4  CL 99  CO2 25  GLUCOSE 119*  BUN 11  CREATININE 0.88  CALCIUM 10.2    CrCl cannot be calculated (Patient's most recent lab result is older than the maximum 21 days allowed.).     Latest Ref Rng & Units 05/31/2021    8:00 AM 08/22/2020    1:43 AM 06/19/2020    8:34 AM  CMP  Glucose 70 - 99 mg/dL 161  096  045   BUN 8 - 27  mg/dL 11  17  13    Creatinine 0.57 - 1.00 mg/dL 4.09  8.11  9.14   Sodium 134 - 144 mmol/L 139  137  141   Potassium 3.5 - 5.2 mmol/L 4.4  4.3  4.8   Chloride 96 - 106 mmol/L 99  104  103   CO2 20 - 29 mmol/L 25  26  26    Calcium 8.7 - 10.3 mg/dL 78.2  9.7  9.8   Total Protein 6.0 - 8.5 g/dL 7.5  6.9  6.9   Total Bilirubin 0.0 - 1.2 mg/dL 0.4  0.2  0.4   Alkaline Phos 44 - 121 IU/L 82  57  65   AST 0 - 40 IU/L 19  19  13    ALT 0 - 32 IU/L 16  13  13        Latest Ref Rng & Units 08/22/2020    1:43 AM 02/23/2019    4:12 PM 06/07/2018    5:09 AM  CBC  WBC 4.0 - 10.5 K/uL 7.2  5.3  7.0   Hemoglobin 12.0 - 15.0 g/dL 95.6  21.3  08.6   Hematocrit 36.0 - 46.0 % 41.0  43.9  37.5   Platelets 150 - 400 K/uL 301  290  299     Lipid Panel Recent Labs    05/31/21 0800  CHOL 173  TRIG 105  LDLCALC 99  HDL 55    HEMOGLOBIN A1C Lab Results  Component Value Date   HGBA1C 6.1 (H) 05/31/2021   MPG 131.24 06/06/2018   External Labs: TSH 2.150 10/31/2018  Medications and allergies   Allergies  Allergen Reactions   Ramipril Cough   Carvedilol Other (See Comments)    Neck pain   Valsartan Other (See Comments)    Bloating   Amlodipine Other (See Comments)    Headache   Repatha [Evolocumab] Other (See Comments)    Neck tightness   Rosuvastatin Other (See Comments)     Current Outpatient Medications:    ALPRAZolam (XANAX) 0.25 MG tablet, Take 1 tablet (0.25 mg total) by mouth daily as needed for anxiety., Disp: 30 tablet, Rfl: 0   aspirin 81 MG tablet, Take 81 mg by mouth daily., Disp: , Rfl:  Cholecalciferol (VITAMIN D) 2000 units CAPS, Take 2,000 Units by mouth daily., Disp: , Rfl:    CRESTOR 20 MG tablet, TAKE 1/2 TABLET BY MOUTH DAILY, Disp: 90 tablet, Rfl: 3   ibuprofen (ADVIL,MOTRIN) 200 MG tablet, Take 200 mg by mouth every 6 (six) hours as needed for moderate pain., Disp: , Rfl:    magnesium 30 MG tablet, Take 30 mg by mouth once a week., Disp: , Rfl:    nitroGLYCERIN  (NITROSTAT) 0.4 MG SL tablet, Place 1 tablet (0.4 mg total) under the tongue every 5 (five) minutes as needed for chest pain., Disp: 30 tablet, Rfl: 1   omeprazole (PRILOSEC) 20 MG capsule, Take 1 capsule (20 mg total) by mouth 2 (two) times daily before a meal. (Patient taking differently: Take 20 mg by mouth daily.), Disp: 28 capsule, Rfl: 0   ranolazine (RANEXA) 500 MG 12 hr tablet, TAKE 1 TABLET(500 MG) BY MOUTH TWICE DAILY AS NEEDED, Disp: 180 tablet, Rfl: 1   valsartan (DIOVAN) 80 MG tablet, TAKE 1 TABLET(80 MG) BY MOUTH DAILY, Disp: 90 tablet, Rfl: 1   ezetimibe (ZETIA) 10 MG tablet, Take 1 tablet (10 mg total) by mouth daily. (Patient not taking: Reported on 05/16/2022), Disp: 90 tablet, Rfl: 3  Radiology:   No results found.  Cardiac Studies:   Stress test 03/08/2015: Patient exercised on Bruce protocol for 9 minutes and 28 seconds and achieved 106% of MPHR.  Occasional PVCs during stress and recovery period, underlying LBBB.Marland Kitchen  Normal blood pressure response.  Poststress LVEF 60%. without additional wall motion abnormality.  Coronary angiogram 06/07/2018: Right dominant circulation, severely diffusely calcified vessels, previously placed stents in the proximal and mid RCA are widely patent with very mild neointimal hyperplasia in the proximal segment.   LAD is diffusely diseased and there is a focal 70 to 75% stenosis which is probably significant by FFR, however the LAD is diffusely diseased.  Lesion does not suggest unstable lesion and is heavily calcified.   Ramus and circumflex are moderately diffusely diseased and calcified without luminal high-grade stenosis.   Normal LV systolic function, normal LVEDP.  (right coronary artery (3.0 x 18 mm Cypher drug-eluting stent on 05/16/2003 patent).   Carotid artery duplex 08/20/2021: Duplex suggests stenosis in the right internal carotid artery (1-15%). Duplex suggests stenosis in the right external carotid artery (<50%). Duplex suggests  stenosis in the left internal carotid artery (1-15%). Duplex suggests stenosis in the left external carotid artery (<50%). Antegrade right vertebral artery flow. Antegrade left vertebral artery flow. Compared to the study done on 08/18/2020, mild bilateral ICA stenosis not present, no change in external carotid artery stenosis.  Follow-up studies if clinically indicated.  PCV ECHOCARDIOGRAM COMPLETE 05/20/2021  Narrative Echocardiogram 05/20/2021: Left ventricle cavity is normal in size and wall thickness. Normal global wall motion. Normal LV systolic function with visual EF 50-55%. Abnormal septal wall motion due to left bundle branch block. Moderate (Grade II) mitral regurgitation. Mild pulmonic regurgitation. Unable to calculate RV systolic pressure due to inadequate TR jet with trace tricupsid reguegitation.    EKG   EKG 05/16/2022: Normal sinus rhythm at rate of 87 bpm, left bundle branch block.  No further analysis.  Compared to 05/19/2021, no significant change.  Previous heart rate was 71 bpm.  Assessment     ICD-10-CM   1. Atherosclerosis of native coronary artery of native heart with stable angina pectoris  I25.118 EKG 12-Lead      No orders of the defined types were placed  in this encounter.   Recommendations:   Tiffany Ryan  is a 67 y.o. Caucasian female with hypertension, hyperglycemia,family history of premature coronary artery disease, multiple medication intolerances, hyperlipidemia, CAD S/P 3.0 x 18 mm Cypher drug-eluting stent to mid RCA placed on 05/16/2003. Coronary angiography on 06/07/2018 revealing severe heavy calcification in all 3 coronary vessels with patent RCA stent and has a 70% to 75% mid to distal LAD stenosis, probably physiologically significant but was recommended medical therapy in view of diffuse calcification and uncontrolled hyperlipidemia.  1. Atherosclerosis of native coronary artery of native heart with stable angina pectoris [facility]) Patient made a stat  appointment to see [place] in view of chest pain, not feeling well.  Chest pain symptoms do not appear to be anginal symptoms, or he is extremely difficult to make or her symptomatology due to multiple medical complaints, extreme stress and anxiety.  I have started her on Bystolic 10 mg daily for hypertension and also for CAD.  Ordered i-STAT troponin.  States that she is now willing to take statins and has been taking her husband's 20 mg of Crestor, pravastatin 40 mg 1/2 tablet daily ordered. - EKG 12-Lead - Troponin I (High Sensitivity); Future - nebivolol (BYSTOLIC) 10 MG tablet; Take 1 tablet (10 mg total) by mouth daily.  Dispense: 30 tablet; Refill: 2 - rosuvastatin (CRESTOR) 40 MG tablet; Take 0.5 tablets (20 mg total) by mouth daily.  Dispense: 90 tablet; Refill: 3  2. Primary hypertension Blood pressure is markedly elevated, stress could be playing a role, hopefully Bystolic will improve blood pressure control per - nebivolol (BYSTOLIC) 10 MG tablet; Take 1 tablet (10 mg total) by mouth daily.  Dispense: 30 tablet; Refill: 2  3. Musculoskeletal chest pain Chest pain symptom clearly musculoskeletal but patient does have known coronary disease.  4. LBBB (left bundle branch block) No change in the EKG. - Troponin I (High Sensitivity); Future  5. Hypercholesteremia  - rosuvastatin (CRESTOR) 40 MG tablet; Take 0.5 tablets (20 mg total) by mouth daily.  Dispense: 90 tablet; Refill: 3    Yates Decamp, MD, [facility] 05/16/2022, 3:57 PM Office: [phone] Fax: [phone] Pager: [phone]

## 2022-06-13 ENCOUNTER — Other Ambulatory Visit: Payer: Self-pay

## 2022-06-13 ENCOUNTER — Encounter: Payer: Self-pay | Admitting: Cardiology

## 2022-06-13 ENCOUNTER — Ambulatory Visit: Payer: Medicare HMO | Admitting: Cardiology

## 2022-06-13 VITALS — BP 140/76 | HR 64 | Resp 16 | Ht 63.0 in | Wt 157.0 lb

## 2022-06-13 DIAGNOSIS — E7800 Pure hypercholesterolemia, unspecified: Secondary | ICD-10-CM | POA: Diagnosis not present

## 2022-06-13 DIAGNOSIS — I25118 Atherosclerotic heart disease of native coronary artery with other forms of angina pectoris: Secondary | ICD-10-CM

## 2022-06-13 DIAGNOSIS — I10 Essential (primary) hypertension: Secondary | ICD-10-CM

## 2022-06-13 MED ORDER — RANOLAZINE ER 500 MG PO TB12: 500.0000 mg | ORAL_TABLET | Freq: Two times a day (BID) | ORAL | 1 refills | Status: DC

## 2022-06-13 MED ORDER — NEBIVOLOL HCL 10 MG PO TABS: 10.0000 mg | ORAL_TABLET | Freq: Every day | ORAL | 3 refills | Status: DC

## 2022-06-13 NOTE — Addendum Note (Signed)
Addended by: Erby Pian on: 06/13/2022 10:42 AM   Modules accepted: Orders

## 2022-06-13 NOTE — Progress Notes (Signed)
Primary Physician/Referring:  Thana Ates, MD  Patient ID: Tiffany Ryan, female    DOB: [birth year], 67 y.o.   MRN: [id number]  Chief Complaint  Patient presents with  . Atherosclerosis of native coronary artery of native heart w  . Hypertension  . Follow-up    4 weeks   HPI:    Tiffany Ryan  is a 67 y.o. Caucasian female with hypertension, hyperglycemia,family history of premature coronary artery disease, multiple medication intolerances, hyperlipidemia, CAD S/P 3.0 x 18 mm Cypher drug-eluting stent to mid RCA placed on 05/16/2003. Coronary angiography on 06/07/2018 revealing severe heavy calcification in all 3 coronary vessels with patent RCA stent and has a 70% to 75% mid to distal LAD stenosis, probably physiologically significant but was recommended medical therapy in view of diffuse calcification and uncontrolled hyperlipidemia.  Patient was seen by me about 4 to 6 weeks ago on a stat appointment due to severe chest pain, patient extremely anxious and emotional and markedly elevated blood pressure.  I had started her on Bystolic she now presents for follow-up.  States that she is doing well, she has not had any further chest pain except occasional exertional pain but since being on Bystolic, she has noticed improvement in angina as well.  States that her blood pressure is under excellent control.  She plans to travel to her home country for 2 months.  Past Medical History:  Diagnosis Date  . CAD (coronary artery disease), native coronary artery 01/20/2019  . COVID-19   . High cholesterol   . Hyperlipemia 08/06/2018  . Hypertension   . Unstable angina (HCC) 03/06/2016    Family History  Problem Relation Age of Onset  . Stroke Father   . Cancer - Lung Sister     Social History   Tobacco Use  . Smoking status: Former    Packs/day: 1.00    Years: 25.00    Additional pack years: 0.00    Total pack years: 25.00    Types: Cigarettes    Quit date: 2006    Years since quitting: 18.3   . Smokeless tobacco: Never  Substance Use Topics  . Alcohol use: Yes    Alcohol/week: 1.0 standard drink of alcohol    Types: 1 Glasses of wine per week    Comment: OCCASSIONAL   Marital Status: Married  ROS  Review of Systems  Cardiovascular:  Positive for chest pain. Negative for dyspnea on exertion and leg swelling.   Objective  Blood pressure (!) 140/76, pulse 64, resp. rate 16, height 5\' 3"  (1.6 m), weight 157 lb (71.2 kg), SpO2 97 %.     06/13/2022   10:02 AM 05/16/2022    3:25 PM 03/28/2022   10:44 AM  Vitals with BMI  Height 5\' 3"  5\' 3"  5\' 3"   Weight 157 lbs 157 lbs 10 oz 157 lbs  BMI 27.82 27.92 27.82  Systolic 140 186 409  Diastolic 76 102 85  Pulse 64 100 81     Physical Exam Vitals reviewed.  Constitutional:      Appearance: She is well-developed.  Neck:     Vascular: No JVD.  Cardiovascular:     Rate and Rhythm: Normal rate and regular rhythm.     Pulses: Intact distal pulses.          Carotid pulses are  on the left side with bruit.      Femoral pulses are 2+ on the right side and 2+ on the left side.  Popliteal pulses are 2+ on the right side and 2+ on the left side.       Dorsalis pedis pulses are 1+ on the right side and 1+ on the left side.       Posterior tibial pulses are 1+ on the right side and 1+ on the left side.     Heart sounds: Normal heart sounds.  Pulmonary:     Effort: Pulmonary effort is normal. No accessory muscle usage or respiratory distress.     Breath sounds: Normal breath sounds.  Abdominal:     General: Bowel sounds are normal.     Palpations: Abdomen is soft.  Musculoskeletal:     Right lower leg: No edema.     Left lower leg: No edema.   Laboratory examination:      Latest Ref Rng & Units 05/31/2021    8:00 AM 08/22/2020    1:43 AM 06/19/2020    8:34 AM  CMP  Glucose 70 - 99 mg/dL 161  096  045   BUN 8 - 27 mg/dL 11  17  13    Creatinine 0.57 - 1.00 mg/dL 4.09  8.11  9.14   Sodium 134 - 144 mmol/L 139  137  141    Potassium 3.5 - 5.2 mmol/L 4.4  4.3  4.8   Chloride 96 - 106 mmol/L 99  104  103   CO2 20 - 29 mmol/L 25  26  26    Calcium 8.7 - 10.3 mg/dL 78.2  9.7  9.8   Total Protein 6.0 - 8.5 g/dL 7.5  6.9  6.9   Total Bilirubin 0.0 - 1.2 mg/dL 0.4  0.2  0.4   Alkaline Phos 44 - 121 IU/L 82  57  65   AST 0 - 40 IU/L 19  19  13    ALT 0 - 32 IU/L 16  13  13        Latest Ref Rng & Units 08/22/2020    1:43 AM 02/23/2019    4:12 PM 06/07/2018    5:09 AM  CBC  WBC 4.0 - 10.5 K/uL 7.2  5.3  7.0   Hemoglobin 12.0 - 15.0 g/dL 95.6  21.3  08.6   Hematocrit 36.0 - 46.0 % 41.0  43.9  37.5   Platelets 150 - 400 K/uL 301  290  299    Lab Results  Component Value Date   HGBA1C 6.1 (H) 05/31/2021   MPG 131.24 06/06/2018   External Labs:  Labs 01/26/2022:  Total cholesterol 155, triglycerides 124, HDL 47, LDL 86.  TSH 2.150 10/31/2018  Radiology:   No results found.  Cardiac Studies:   Stress test 03/08/2015: Patient exercised on Bruce protocol for 9 minutes and 28 seconds and achieved 106% of MPHR.  Occasional PVCs during stress and recovery period, underlying LBBB.Marland Kitchen  Normal blood pressure response.  Poststress LVEF 60%. without additional wall motion abnormality.  Coronary angiogram 06/07/2018: Right dominant circulation, severely diffusely calcified vessels, previously placed stents in the proximal and mid RCA are widely patent with very mild neointimal hyperplasia in the proximal segment.   LAD is diffusely diseased and there is a focal 70 to 75% stenosis which is probably significant by FFR, however the LAD is diffusely diseased.  Lesion does not suggest unstable lesion and is heavily calcified.   Ramus and circumflex are moderately diffusely diseased and calcified without luminal high-grade stenosis.   Normal LV systolic function, normal LVEDP.  (right coronary artery (3.0 x 18 mm Cypher  drug-eluting stent on 05/16/2003 patent).   Carotid artery duplex 08/20/2021: Duplex suggests stenosis in  the right internal carotid artery (1-15%). Duplex suggests stenosis in the right external carotid artery (<50%). Duplex suggests stenosis in the left internal carotid artery (1-15%). Duplex suggests stenosis in the left external carotid artery (<50%). Antegrade right vertebral artery flow. Antegrade left vertebral artery flow. Compared to the study done on 08/18/2020, mild bilateral ICA stenosis not present, no change in external carotid artery stenosis.  Follow-up studies if clinically indicated.  PCV ECHOCARDIOGRAM COMPLETE 05/20/2021  Narrative Echocardiogram 05/20/2021: Left ventricle cavity is normal in size and wall thickness. Normal global wall motion. Normal LV systolic function with visual EF 50-55%. Abnormal septal wall motion due to left bundle branch block. Moderate (Grade II) mitral regurgitation. Mild pulmonic regurgitation. Unable to calculate RV systolic pressure due to inadequate TR jet with trace tricupsid reguegitation.    EKG   EKG 05/16/2022: Normal sinus rhythm at rate of 87 bpm, left bundle branch block.  No further analysis.  Compared to 05/19/2021, no significant change.  Previous heart rate was 71 bpm.   Medications and allergies   Allergies  Allergen Reactions  . Ramipril Cough  . Spironolactone Other (See Comments)    Chest pain  . Carvedilol Other (See Comments)    Neck pain  . Valsartan Other (See Comments)    Bloating  . Amlodipine Other (See Comments)    Headache  . Repatha [Evolocumab] Other (See Comments)    Neck tightness  . Rosuvastatin Other (See Comments)    Current Outpatient Medications:  .  ALPRAZolam (XANAX) 0.25 MG tablet, Take 1 tablet (0.25 mg total) by mouth daily as needed for anxiety., Disp: 30 tablet, Rfl: 0 .  aspirin 81 MG tablet, Take 81 mg by mouth daily., Disp: , Rfl:  .  Cholecalciferol (VITAMIN D) 2000 units CAPS, Take 2,000 Units by mouth daily., Disp: , Rfl:  .  ezetimibe (ZETIA) 10 MG tablet, Take 1 tablet (10 mg total)  by mouth daily., Disp: 90 tablet, Rfl: 3 .  ibuprofen (ADVIL,MOTRIN) 200 MG tablet, Take 200 mg by mouth every 6 (six) hours as needed for moderate pain., Disp: , Rfl:  .  magnesium 30 MG tablet, Take 30 mg by mouth once a week., Disp: , Rfl:  .  nitroGLYCERIN (NITROSTAT) 0.4 MG SL tablet, Place 1 tablet (0.4 mg total) under the tongue every 5 (five) minutes as needed for chest pain., Disp: 30 tablet, Rfl: 1 .  omeprazole (PRILOSEC) 20 MG capsule, Take 1 capsule (20 mg total) by mouth 2 (two) times daily before a meal. (Patient taking differently: Take 20 mg by mouth daily.), Disp: 28 capsule, Rfl: 0 .  rosuvastatin (CRESTOR) 40 MG tablet, Take 0.5 tablets (20 mg total) by mouth daily., Disp: 90 tablet, Rfl: 3 .  nebivolol (BYSTOLIC) 10 MG tablet, Take 1 tablet (10 mg total) by mouth daily., Disp: 90 tablet, Rfl: 3 .  ranolazine (RANEXA) 500 MG 12 hr tablet, Take 1 tablet (500 mg total) by mouth 2 (two) times daily., Disp: 180 tablet, Rfl: 1   Assessment     ICD-10-CM   1. Primary hypertension  I10 nebivolol (BYSTOLIC) 10 MG tablet    2. Atherosclerosis of native coronary artery of native heart with stable angina pectoris (HCC)  I25.118 nebivolol (BYSTOLIC) 10 MG tablet    ranolazine (RANEXA) 500 MG 12 hr tablet    3. White coat syndrome with diagnosis of hypertension  I10  4. Hypercholesteremia  E78.00       Meds ordered this encounter  Medications  . nebivolol (BYSTOLIC) 10 MG tablet    Sig: Take 1 tablet (10 mg total) by mouth daily.    Dispense:  90 tablet    Refill:  3  . ranolazine (RANEXA) 500 MG 12 hr tablet    Sig: Take 1 tablet (500 mg total) by mouth 2 (two) times daily.    Dispense:  180 tablet    Refill:  1    Recommendations:   Denise Grbic  is a 67 y.o. Caucasian female with hypertension, hyperglycemia,family history of premature coronary artery disease, multiple medication intolerances, hyperlipidemia, CAD S/P 3.0 x 18 mm Cypher drug-eluting stent to mid RCA placed  on 05/16/2003. Coronary angiography on 06/07/2018 revealing severe heavy calcification in all 3 coronary vessels with patent RCA stent and has a 70% to 75% mid to distal LAD stenosis, probably physiologically significant but was recommended medical therapy in view of diffuse calcification and uncontrolled hyperlipidemia.  1. Primary hypertension Patient was seen by me about 4 to 6 weeks ago for musculoskeletal chest pain and also markedly elevated blood pressure, she was extremely emotional.  Blood pressure is now improved with just 10 mg of Bystolic, she is tolerating this without side effects, prescription sent.  Although blood pressure was elevated still today, patient brings home recordings which are significantly improved and normal around 120-130 range.  - nebivolol (BYSTOLIC) 10 MG tablet; Take 1 tablet (10 mg total) by mouth daily.  Dispense: 90 tablet; Refill: 3  2. Atherosclerosis of native coronary artery of native heart with stable angina pectoris [facility]) Patient would like to restart ranolazine for her angina pectoris, I have resent the prescription.  However since being on Bystolic, she is also noticed improvement in anginal symptoms as well. - nebivolol (BYSTOLIC) 10 MG tablet; Take 1 tablet (10 mg total) by mouth daily.  Dispense: 90 tablet; Refill: 3 - ranolazine (RANEXA) 500 MG 12 hr tablet; Take 1 tablet (500 mg total) by mouth 2 (two) times daily.  Dispense: 180 tablet; Refill: 1  3. White coat syndrome with diagnosis of hypertension As stated above, she does have whitecoat hypertension, blood pressure at home has been well-controlled.  4.  Hypercholesterolemia With regard to hypercholesterolemia, I reviewed her external labs, LDL is less than 700.  She does not want to be on injectables.  At least I am very glad that she is now tolerating Crestor and ezetimibe at 40 and 10 mg respectively.    Yates Decamp, MD, [facility] 06/13/2022, 10:33 AM Office: [phone] Fax: [phone] Pager:  [phone]

## 2022-06-16 DIAGNOSIS — S99922A Unspecified injury of left foot, initial encounter: Secondary | ICD-10-CM | POA: Diagnosis not present

## 2022-09-01 ENCOUNTER — Ambulatory Visit: Payer: Medicare HMO | Admitting: Cardiology

## 2022-10-19 ENCOUNTER — Other Ambulatory Visit: Payer: Self-pay | Admitting: Internal Medicine

## 2022-10-19 DIAGNOSIS — Z1231 Encounter for screening mammogram for malignant neoplasm of breast: Secondary | ICD-10-CM

## 2022-10-25 DIAGNOSIS — H6993 Unspecified Eustachian tube disorder, bilateral: Secondary | ICD-10-CM | POA: Diagnosis not present

## 2022-10-25 DIAGNOSIS — H6122 Impacted cerumen, left ear: Secondary | ICD-10-CM | POA: Diagnosis not present

## 2022-11-10 ENCOUNTER — Ambulatory Visit
Admission: RE | Admit: 2022-11-10 | Discharge: 2022-11-10 | Disposition: A | Discharge status: Home / Self Care | Payer: Medicare HMO | Source: Ambulatory Visit | Attending: Physician Assistant | Admitting: Physician Assistant

## 2022-11-10 ENCOUNTER — Ambulatory Visit
Admission: RE | Admit: 2022-11-10 | Discharge: 2022-11-10 | Disposition: A | Discharge status: Home / Self Care | Payer: Medicare HMO | Source: Ambulatory Visit | Attending: Internal Medicine | Admitting: Internal Medicine

## 2022-11-10 ENCOUNTER — Other Ambulatory Visit: Payer: Self-pay | Admitting: Physician Assistant

## 2022-11-10 DIAGNOSIS — R0989 Other specified symptoms and signs involving the circulatory and respiratory systems: Secondary | ICD-10-CM

## 2022-11-10 DIAGNOSIS — J392 Other diseases of pharynx: Secondary | ICD-10-CM | POA: Diagnosis not present

## 2022-11-10 DIAGNOSIS — Z1231 Encounter for screening mammogram for malignant neoplasm of breast: Secondary | ICD-10-CM

## 2022-11-25 DIAGNOSIS — I25118 Atherosclerotic heart disease of native coronary artery with other forms of angina pectoris: Secondary | ICD-10-CM | POA: Diagnosis not present

## 2022-11-25 DIAGNOSIS — E559 Vitamin D deficiency, unspecified: Secondary | ICD-10-CM | POA: Diagnosis not present

## 2022-11-25 DIAGNOSIS — Z79899 Other long term (current) drug therapy: Secondary | ICD-10-CM | POA: Diagnosis not present

## 2022-12-14 ENCOUNTER — Ambulatory Visit: Payer: Medicare HMO | Attending: Cardiology | Admitting: Cardiology

## 2022-12-14 ENCOUNTER — Other Ambulatory Visit: Payer: Self-pay | Admitting: *Deleted

## 2022-12-14 ENCOUNTER — Encounter: Payer: Self-pay | Admitting: Cardiology

## 2022-12-14 VITALS — BP 132/78 | HR 70 | Resp 16 | Ht 63.0 in | Wt 155.6 lb

## 2022-12-14 DIAGNOSIS — I10 Essential (primary) hypertension: Secondary | ICD-10-CM

## 2022-12-14 DIAGNOSIS — I25118 Atherosclerotic heart disease of native coronary artery with other forms of angina pectoris: Secondary | ICD-10-CM | POA: Diagnosis not present

## 2022-12-14 DIAGNOSIS — E7800 Pure hypercholesterolemia, unspecified: Secondary | ICD-10-CM

## 2022-12-14 MED ORDER — NEBIVOLOL HCL 2.5 MG PO TABS: 2.5000 mg | ORAL_TABLET | Freq: Every day | ORAL | 3 refills | Status: DC

## 2022-12-14 MED ORDER — NEBIVOLOL HCL 10 MG PO TABS: ORAL_TABLET | ORAL | 2 refills | Status: DC

## 2022-12-14 NOTE — Patient Instructions (Signed)
Medication Instructions:  Your physician has recommended you make the following change in your medication:  Start Bystolic 2.5 mg by mouth daily. May take Bystolic 10 mg by mouth as needed for BP greater than 140  *If you need a refill on your cardiac medications before your next appointment, please call your pharmacy*   Lab Work: Your physician recommends that you return for lab work in: January--Lipid profile.  This will be fasting.  The lab is open from 8 AM- 4 PM Monday through Friday.  It is closed from 12:30-1:30  If you have labs (blood work) drawn today and your tests are completely normal, you will receive your results only by: MyChart Message (if you have MyChart) OR A paper copy in the mail If you have any lab test that is abnormal or we need to change your treatment, we will call you to review the results.   Testing/Procedures: none   Follow-Up: At [facility], you and your health needs are our priority.  As part of our continuing mission to provide you with exceptional heart care, we have created designated Provider Care Teams.  These Care Teams include your primary Cardiologist (physician) and Advanced Practice Providers (APPs -  Physician Assistants and Nurse Practitioners) who all work together to provide you with the care you need, when you need it.  We recommend signing up for the patient portal called "MyChart".  Sign up information is provided on this After Visit Summary.  MyChart is used to connect with patients for Virtual Visits (Telemedicine).  Patients are able to view lab/test results, encounter notes, upcoming appointments, etc.  Non-urgent messages can be sent to your provider as well.   To learn more about what you can do with MyChart, go to [email or web address].    Your next appointment:   12 month(s)  Provider:   Yates Decamp, MD     Other Instructions

## 2022-12-14 NOTE — Progress Notes (Signed)
Cardiology Office Note:  .   Date:  12/14/2022  ID:  Tiffany Ryan, DOB [birth year], MRN [id number] PCP: Thana Ates, MD  [facility] Providers Cardiologist:  Yates Decamp, MD   History of Present Illness: .   Tiffany Ryan is a 67 y.o. Caucasian female with family history of premature coronary artery disease, hypertension, hyperglycemia,multiple medication intolerances, hyperlipidemia, CAD S/P 3.0 x 18 mm Cypher drug-eluting stent to mid RCA placed on 05/16/2003. Coronary angiography on 06/07/2018 revealing severe heavy calcification in all 3 coronary vessels with patent RCA stent and has a 70% to 75% mid to distal LAD stenosis, probably physiologically significant but was recommended medical therapy in view of diffuse calcification and uncontrolled hyperlipidemia.   Discussed the use of AI scribe software for clinical note transcription with the patient, who gave verbal consent to proceed.  History of Present Illness   The patient, with a history of hyperlipidemia and hypertension, presents for a routine follow-up after a recent trip abroad. She reports feeling well overall, but has been experiencing cold feet and associated abdominal pain, which she attributes to the winter season. She has been managing her cholesterol with Crestor 20mg  (half of a 40mg  tablet) and Zetia, which she had stopped but restarted due to an increase in LDL levels. She expresses a desire to have her cholesterol levels rechecked in January.  For her hypertension, she has been off her blood pressure medication, Bystolic, for about 25 days without any problems. However, she requests a prescription for it to have on hand, especially for when she travels and her diet changes. She also mentions that she takes Xanax as needed, particularly when she is unable to sleep due to jet lag from travel.  The patient also takes aspirin and an acid reflux medication as needed. She reports that walking 2-3 miles a day has been beneficial  for her overall health.      Review of Systems  Cardiovascular:  Negative for chest pain, dyspnea on exertion and leg swelling.   Labs   Lab Results  Component Value Date   CHOL 173 05/31/2021   HDL 55 05/31/2021   LDLCALC 99 05/31/2021   LDLDIRECT 90 01/16/2019   TRIG 105 05/31/2021   CHOLHDL 5.3 06/06/2018   Lab Results  Component Value Date   NA 139 05/31/2021   K 4.4 05/31/2021   CO2 25 05/31/2021   GLUCOSE 119 (H) 05/31/2021   BUN 11 05/31/2021   CREATININE 0.88 05/31/2021   CALCIUM 10.2 05/31/2021   EGFR 73 05/31/2021   GFRNONAA >60 08/22/2020      Latest Ref Rng & Units 05/31/2021    8:00 AM 08/22/2020    1:43 AM 06/19/2020    8:34 AM  BMP  Glucose 70 - 99 mg/dL 629  528  413   BUN 8 - 27 mg/dL 11  17  13    Creatinine 0.57 - 1.00 mg/dL 2.44  0.10  2.72   BUN/Creat Ratio 12 - 28 13   15    Sodium 134 - 144 mmol/L 139  137  141   Potassium 3.5 - 5.2 mmol/L 4.4  4.3  4.8   Chloride 96 - 106 mmol/L 99  104  103   CO2 20 - 29 mmol/L 25  26  26    Calcium 8.7 - 10.3 mg/dL 53.6  9.7  9.8        Latest Ref Rng & Units 08/22/2020    1:43 AM 02/23/2019    4:12  PM 06/07/2018    5:09 AM  CBC  WBC 4.0 - 10.5 K/uL 7.2  5.3  7.0   Hemoglobin 12.0 - 15.0 g/dL 16.1  09.6  04.5   Hematocrit 36.0 - 46.0 % 41.0  43.9  37.5   Platelets 150 - 400 K/uL 301  290  299     External Labs:  Labs 01/26/2022:   Total cholesterol 155, triglycerides 124, HDL 47, LDL 86.  Physical Exam:   VS:  BP 132/78 (BP Location: Right Arm, Patient Position: Sitting, Cuff Size: Normal)   Pulse 70   Resp 16   Ht 5\' 3"  (1.6 m)   Wt 155 lb 9.6 oz (70.6 kg)   SpO2 98%   BMI 27.56 kg/m    Wt Readings from Last 3 Encounters:  12/14/22 155 lb 9.6 oz (70.6 kg)  06/13/22 157 lb (71.2 kg)  05/16/22 157 lb 9.6 oz (71.5 kg)    Physical Exam Neck:     Vascular: No carotid bruit or JVD.  Cardiovascular:     Rate and Rhythm: Normal rate and regular rhythm.     Pulses: Intact distal pulses.      Heart sounds: Normal heart sounds. No murmur heard.    No gallop.  Pulmonary:     Effort: Pulmonary effort is normal.     Breath sounds: Normal breath sounds.  Abdominal:     General: Bowel sounds are normal.     Palpations: Abdomen is soft.  Musculoskeletal:     Right lower leg: No edema.     Left lower leg: No edema.    Studies Reviewed: Marland Kitchen    Coronary angiogram 06/07/2018: Right dominant circulation, severely diffusely calcified vessels, previously placed stents in the proximal and mid RCA are widely patent with very mild neointimal hyperplasia in the proximal segment.   LAD is diffusely diseased and there is a focal 70 to 75% stenosis which is probably significant by FFR, however the LAD is diffusely diseased.  Lesion does not suggest unstable lesion and is heavily calcified.   Ramus and circumflex are moderately diffusely diseased and calcified without luminal high-grade stenosis.   Normal LV systolic function, normal LVEDP.   (right coronary artery (3.0 x 18 mm Cypher drug-eluting stent on 05/16/2003 patent).    Carotid artery duplex 08/20/2021: Duplex suggests stenosis in the right internal carotid artery (1-15%). Duplex suggests stenosis in the right external carotid artery (<50%). Duplex suggests stenosis in the left internal carotid artery (1-15%). Duplex suggests stenosis in the left external carotid artery (<50%). Antegrade right vertebral artery flow. Antegrade left vertebral artery flow. Compared to the study done on 08/18/2020, mild bilateral ICA stenosis not present, no change in external carotid artery stenosis.  Follow-up studies if clinically indicated.   PCV ECHOCARDIOGRAM COMPLETE 05/20/2021   Narrative Echocardiogram 05/20/2021: Left ventricle cavity is normal in size and wall thickness. Normal global wall motion. Normal LV systolic function with visual EF 50-55%. Abnormal septal wall motion due to left bundle branch block. Moderate (Grade II) mitral  regurgitation. Mild pulmonic regurgitation. Unable to calculate RV systolic pressure due to inadequate TR jet with trace tricupsid reguegitation.  ABI 02/22/2022:   EKG:         EKG 05/16/2022: Normal sinus rhythm at rate of 87 bpm, left bundle branch block. No further analysis. Compared to 05/19/2021, no significant change. Previous heart rate was 71 bpm.   Medications and allergies    Allergies  Allergen Reactions   Ramipril Cough  Spironolactone Other (See Comments)    Chest pain   Carvedilol Other (See Comments)    Neck pain   Valsartan Other (See Comments)    Bloating   Amlodipine Other (See Comments)    Headache   Repatha [Evolocumab] Other (See Comments)    Neck tightness   Rosuvastatin Other (See Comments)    Current Meds  Medication Sig   ALPRAZolam (XANAX) 0.25 MG tablet Take 1 tablet (0.25 mg total) by mouth daily as needed for anxiety.   aspirin 81 MG tablet Take 81 mg by mouth daily.   Cholecalciferol (VITAMIN D) 2000 units CAPS Take 2,000 Units by mouth daily.   ezetimibe (ZETIA) 10 MG tablet Take 1 tablet (10 mg total) by mouth daily.   ibuprofen (ADVIL,MOTRIN) 200 MG tablet Take 200 mg by mouth every 6 (six) hours as needed for moderate pain.   magnesium 30 MG tablet Take 30 mg by mouth once a week.   nebivolol (BYSTOLIC) 10 MG tablet Take one tablet by mouth daily as needed for BP greater than 140   nebivolol (BYSTOLIC) 2.5 MG tablet Take 1 tablet (2.5 mg total) by mouth daily.   nitroGLYCERIN (NITROSTAT) 0.4 MG SL tablet Place 1 tablet (0.4 mg total) under the tongue every 5 (five) minutes as needed for chest pain.   omeprazole (PRILOSEC) 20 MG capsule Take 1 capsule (20 mg total) by mouth 2 (two) times daily before a meal. (Patient taking differently: Take 20 mg by mouth daily.)   rosuvastatin (CRESTOR) 40 MG tablet Take 0.5 tablets (20 mg total) by mouth daily.     ASSESSMENT AND PLAN: .      ICD-10-CM   1. Atherosclerosis of native coronary artery  of native heart with stable angina pectoris (HCC)  I25.118     2. Primary hypertension  I10     3. Hypercholesteremia  E78.00 Lipid Profile      There are no diagnoses linked to this encounter.  Assessment and Plan  1. Atherosclerosis of native coronary artery of native heart with stable angina pectoris (HCC) Fortunately although patient has severe diffuse coronary disease and calcific disease being treated medically, she has multiple medication intolerances in fact she has discontinued her beta-blocker therapy as well and has reduced her dose of Crestor from 40 mg to 20 mg and had discontinued Zetia as well but she has restarted this back.  Patient's fortunately blood pressure is now well-controlled, she does have whitecoat hypertension as well, advised her that instead of taking 10 mg of Bystolic on a as needed basis to start with 2.5 mg daily as patient states that taking 5 mg C1 makes her to have fatigue.  She will continue to use 10 mg as needed when she travels to [place] where she tends to eat more salty food and blood pressure tends to be higher.  2. Primary hypertension Her blood pressure today is well-controlled in spite of patient not being on any medications however I would like her to be on a low-dose of a beta-blocker in view of coronary artery disease and hypertension.  3. Hypercholesteremia She has stopped Zetia for a while and has restarted Zetia now, has reduced the dose of Crestor from 40 mg to 20 mg, she would like to have lipids checked in January which I have ordered today.  Otherwise fortunately she remained stable from cardiac standpoint I will see her back in a year or sooner if problems.   Patient has Xanax at home for anxiety,  has not filled it in many months, she would like to have a refill, I will forward this note to her PCP for consideration for refill of Xanax.  Signed,  Yates Decamp, MD, [facility] 12/14/2022, 5:45 PM [facility] [address]  [address] [place], [place] [address] Phone: [phone]. Fax:  [phone]

## 2023-02-16 DIAGNOSIS — M7582 Other shoulder lesions, left shoulder: Secondary | ICD-10-CM | POA: Diagnosis not present

## 2023-02-16 DIAGNOSIS — M542 Cervicalgia: Secondary | ICD-10-CM | POA: Diagnosis not present

## 2023-02-16 DIAGNOSIS — M7581 Other shoulder lesions, right shoulder: Secondary | ICD-10-CM | POA: Diagnosis not present

## 2023-03-08 DIAGNOSIS — M7501 Adhesive capsulitis of right shoulder: Secondary | ICD-10-CM | POA: Diagnosis not present

## 2023-03-15 DIAGNOSIS — M25611 Stiffness of right shoulder, not elsewhere classified: Secondary | ICD-10-CM | POA: Diagnosis not present

## 2023-03-15 DIAGNOSIS — M25511 Pain in right shoulder: Secondary | ICD-10-CM | POA: Diagnosis not present

## 2023-03-20 DIAGNOSIS — M25511 Pain in right shoulder: Secondary | ICD-10-CM | POA: Diagnosis not present

## 2023-03-20 DIAGNOSIS — M25611 Stiffness of right shoulder, not elsewhere classified: Secondary | ICD-10-CM | POA: Diagnosis not present

## 2023-03-23 DIAGNOSIS — M25611 Stiffness of right shoulder, not elsewhere classified: Secondary | ICD-10-CM | POA: Diagnosis not present

## 2023-03-23 DIAGNOSIS — M7501 Adhesive capsulitis of right shoulder: Secondary | ICD-10-CM | POA: Diagnosis not present

## 2023-03-23 DIAGNOSIS — M19011 Primary osteoarthritis, right shoulder: Secondary | ICD-10-CM | POA: Diagnosis not present

## 2023-03-23 DIAGNOSIS — K219 Gastro-esophageal reflux disease without esophagitis: Secondary | ICD-10-CM | POA: Diagnosis not present

## 2023-03-23 DIAGNOSIS — M25511 Pain in right shoulder: Secondary | ICD-10-CM | POA: Diagnosis not present

## 2023-03-23 DIAGNOSIS — M791 Myalgia, unspecified site: Secondary | ICD-10-CM | POA: Diagnosis not present

## 2023-03-23 DIAGNOSIS — R109 Unspecified abdominal pain: Secondary | ICD-10-CM | POA: Diagnosis not present

## 2023-04-11 IMAGING — CR DG CHEST 2V
2 series · 2 of 2 positions shown · non-contrast
Comparison: Radiograph 02/23/2019

CLINICAL DATA: Chest pain.  Intermittent pain for 2 days.

EXAM:
CHEST - 2 VIEW

[chest lat]
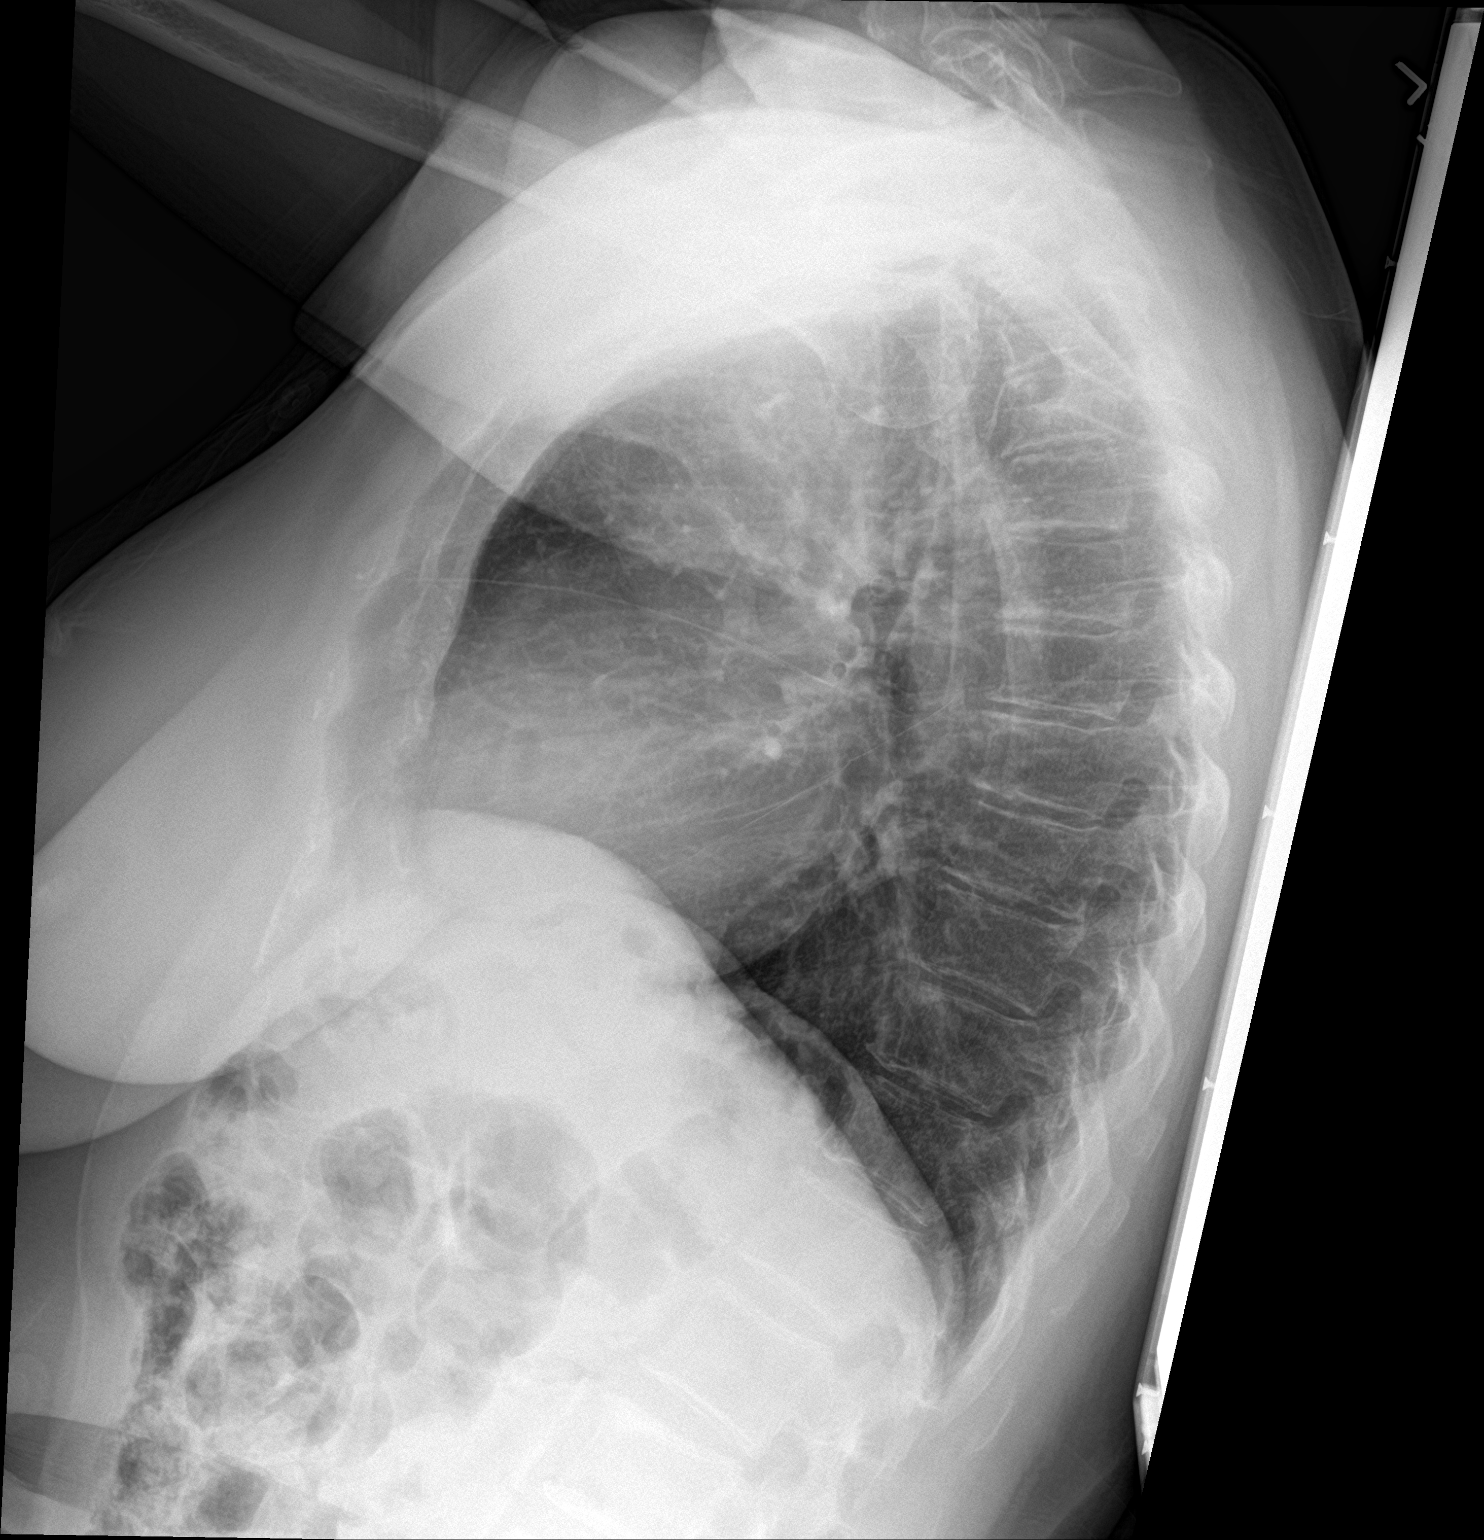

[chest ap]
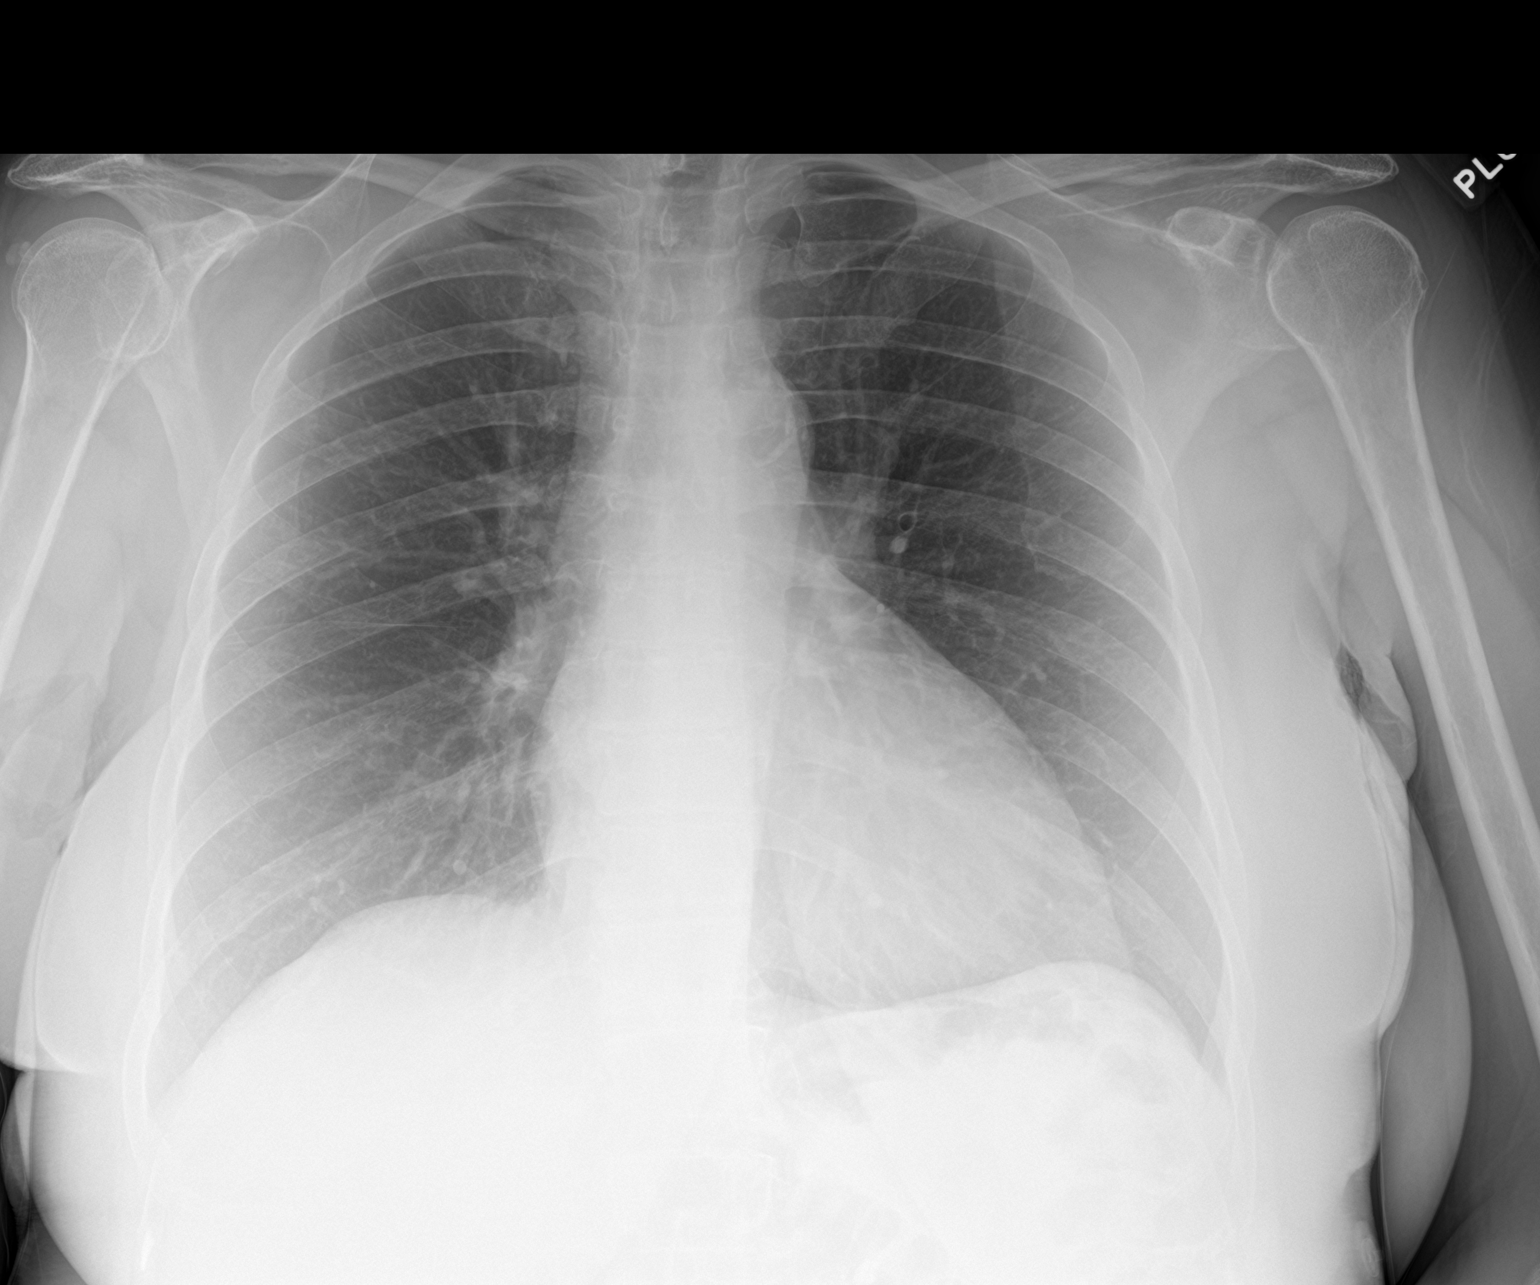

[2 of 2 positions shown; findings below may reference images not displayed]

FINDINGS: The cardiomediastinal contours are normal. Atherosclerosis of the
aortic arch. Pulmonary vasculature is normal. No consolidation,
pleural effusion, or pneumothorax. No acute osseous abnormalities
are seen.
IMPRESSION: No acute chest findings.

## 2023-04-12 DIAGNOSIS — M25511 Pain in right shoulder: Secondary | ICD-10-CM | POA: Diagnosis not present

## 2023-04-12 DIAGNOSIS — M25611 Stiffness of right shoulder, not elsewhere classified: Secondary | ICD-10-CM | POA: Diagnosis not present

## 2023-04-18 DIAGNOSIS — M25511 Pain in right shoulder: Secondary | ICD-10-CM | POA: Diagnosis not present

## 2023-04-18 DIAGNOSIS — M25611 Stiffness of right shoulder, not elsewhere classified: Secondary | ICD-10-CM | POA: Diagnosis not present

## 2023-04-20 DIAGNOSIS — M25511 Pain in right shoulder: Secondary | ICD-10-CM | POA: Diagnosis not present

## 2023-04-20 DIAGNOSIS — M25611 Stiffness of right shoulder, not elsewhere classified: Secondary | ICD-10-CM | POA: Diagnosis not present

## 2023-05-11 DIAGNOSIS — M25611 Stiffness of right shoulder, not elsewhere classified: Secondary | ICD-10-CM | POA: Diagnosis not present

## 2023-05-24 ENCOUNTER — Other Ambulatory Visit: Payer: Self-pay | Admitting: Obstetrics and Gynecology

## 2023-05-24 ENCOUNTER — Other Ambulatory Visit (HOSPITAL_COMMUNITY)
Admission: RE | Admit: 2023-05-24 | Discharge: 2023-05-24 | Disposition: A | Discharge status: Home / Self Care | Source: Ambulatory Visit | Attending: Obstetrics and Gynecology | Admitting: Obstetrics and Gynecology

## 2023-05-24 DIAGNOSIS — Z1151 Encounter for screening for human papillomavirus (HPV): Secondary | ICD-10-CM | POA: Insufficient documentation

## 2023-05-24 DIAGNOSIS — R102 Pelvic and perineal pain: Secondary | ICD-10-CM | POA: Diagnosis not present

## 2023-05-24 DIAGNOSIS — Z124 Encounter for screening for malignant neoplasm of cervix: Secondary | ICD-10-CM | POA: Insufficient documentation

## 2023-05-25 DIAGNOSIS — R102 Pelvic and perineal pain: Secondary | ICD-10-CM | POA: Diagnosis not present

## 2023-05-29 LAB — CYTOLOGY - PAP
Adequacy: ABSENT
Comment: NEGATIVE
Diagnosis: NEGATIVE
High risk HPV: NEGATIVE

## 2023-06-07 DIAGNOSIS — M25511 Pain in right shoulder: Secondary | ICD-10-CM | POA: Diagnosis not present

## 2023-06-20 DIAGNOSIS — M25511 Pain in right shoulder: Secondary | ICD-10-CM | POA: Diagnosis not present

## 2023-07-04 DIAGNOSIS — M75121 Complete rotator cuff tear or rupture of right shoulder, not specified as traumatic: Secondary | ICD-10-CM | POA: Diagnosis not present

## 2023-07-04 DIAGNOSIS — M26609 Unspecified temporomandibular joint disorder, unspecified side: Secondary | ICD-10-CM | POA: Diagnosis not present

## 2023-07-04 DIAGNOSIS — M542 Cervicalgia: Secondary | ICD-10-CM | POA: Diagnosis not present

## 2023-07-05 DIAGNOSIS — M5124 Other intervertebral disc displacement, thoracic region: Secondary | ICD-10-CM | POA: Diagnosis not present

## 2023-07-05 DIAGNOSIS — M5021 Other cervical disc displacement,  high cervical region: Secondary | ICD-10-CM | POA: Diagnosis not present

## 2023-07-05 DIAGNOSIS — M9901 Segmental and somatic dysfunction of cervical region: Secondary | ICD-10-CM | POA: Diagnosis not present

## 2023-07-05 DIAGNOSIS — M9902 Segmental and somatic dysfunction of thoracic region: Secondary | ICD-10-CM | POA: Diagnosis not present

## 2023-09-24 DIAGNOSIS — E7800 Pure hypercholesterolemia, unspecified: Secondary | ICD-10-CM | POA: Diagnosis not present

## 2023-09-24 DIAGNOSIS — M19011 Primary osteoarthritis, right shoulder: Secondary | ICD-10-CM | POA: Diagnosis not present

## 2023-09-24 DIAGNOSIS — I25118 Atherosclerotic heart disease of native coronary artery with other forms of angina pectoris: Secondary | ICD-10-CM | POA: Diagnosis not present

## 2023-10-25 DIAGNOSIS — Z1212 Encounter for screening for malignant neoplasm of rectum: Secondary | ICD-10-CM | POA: Diagnosis not present

## 2023-10-25 DIAGNOSIS — Z1211 Encounter for screening for malignant neoplasm of colon: Secondary | ICD-10-CM | POA: Diagnosis not present

## 2023-10-26 DIAGNOSIS — K5900 Constipation, unspecified: Secondary | ICD-10-CM | POA: Diagnosis not present

## 2023-10-26 DIAGNOSIS — M549 Dorsalgia, unspecified: Secondary | ICD-10-CM | POA: Diagnosis not present

## 2023-10-26 DIAGNOSIS — R102 Pelvic and perineal pain unspecified side: Secondary | ICD-10-CM | POA: Diagnosis not present

## 2023-10-30 ENCOUNTER — Other Ambulatory Visit: Payer: Self-pay | Admitting: Internal Medicine

## 2023-10-30 DIAGNOSIS — Z1231 Encounter for screening mammogram for malignant neoplasm of breast: Secondary | ICD-10-CM

## 2023-10-31 DIAGNOSIS — R102 Pelvic and perineal pain unspecified side: Secondary | ICD-10-CM | POA: Diagnosis not present

## 2023-11-01 LAB — COLOGUARD: COLOGUARD: NEGATIVE

## 2023-11-13 ENCOUNTER — Ambulatory Visit
Admission: RE | Admit: 2023-11-13 | Discharge: 2023-11-13 | Disposition: A | Discharge status: Home / Self Care | Source: Ambulatory Visit | Attending: Internal Medicine | Admitting: Internal Medicine

## 2023-11-13 DIAGNOSIS — Z1231 Encounter for screening mammogram for malignant neoplasm of breast: Secondary | ICD-10-CM | POA: Diagnosis not present

## 2023-11-27 ENCOUNTER — Telehealth: Payer: Self-pay | Admitting: Cardiology

## 2023-11-27 NOTE — Telephone Encounter (Signed)
 Patient identification verified by 2 forms.   Called and spoke to patient  Patient states:  -CP and SOB.  -Has taken NTG daily - has helped -Has taken Ibuprofen - it has not helped -Has taken Aspirin  - has helped.  -I know this is not really an emergency so I don't want to go to the emergency department.   Patient denies:  -Wanting to see anyone but Dr. Ladona         Interventions/Plan: -After telling pt Dr./ Ladona [place] and will not have an opening until January she is willing to be seen by another provider.  -Appt made for Thursday.   Reviewed ED warning signs/precautions  Patient agrees with plan, no questions at this time

## 2023-11-27 NOTE — Telephone Encounter (Signed)
  Pt c/o of Chest Pain: STAT if active CP, including tightness, pressure, jaw pain, radiating pain to shoulder/upper arm/back, CP unrelieved by Nitro. Symptoms reported of SOB, nausea, vomiting, sweating.  1. Are you having CP right now?   No  2. Are you experiencing any other symptoms (ex. SOB, nausea, vomiting, sweating)?   SOB all the time  3. Is your CP continuous or coming and going? Coming and going  4. Have you taken Nitroglycerin ?   Yes  5. How long have you been experiencing CP?   Last 10 days    6. If NO CP at time of call then end call with telling Pt to call back or call 911 if Chest pain returns prior to return call from triage team.   Patient stated she has had chest and neck pain for the last 10 days.

## 2023-11-28 NOTE — Progress Notes (Unsigned)
 Cardiology Office Note:    Date:  11/30/2023   ID:  Tiffany Ryan, DOB 1956-01-11, MRN [id number]  PCP:  Dwight Trula SQUIBB, MD   [facility] Providers Cardiologist:  Gordy Bergamo, MD     Referring MD: Dwight Trula SQUIBB, MD   Chief Complaint  Patient presents with   Follow-up    Chest pain, DOE    History of Present Illness:    Tiffany Ryan is a 68 y.o. female with a hx of family history of premature CAD, hypertension, multiple medication intolerances, CAD s/p DES-mid RCA (3.0 x 18 mm - 05/16/2003).   Heart catheterization 06/07/2018 showed severe heavy calcification in all 3 vessels with patent RCA stent.  She had 70-75% stenosis in the mid to distal LAD felt physiologically significant but recommended for medical therapy in the ICU of diffuse calcification and uncontrolled hyperlipidemia.  She was last seen by Dr. Bergamo 11/2022 and reported cold feet and abdominal pain.  She takes 20 mg Crestor  and Zetia , restarted after prior labs showed uncontrolled LDL.  She had also stopped her Bystolic  and wanted a script for PRN dosing.   She called our office 11/27/23 and reportedly had been using NTG for chest pain and requested an appt with Dr. Bergamo, MD only.   She was placed on my schedule. She describes SOB, neck pain, and bilateral arm pain. She also reports chest pain that feels like someone is sitting on her chest. She continues to exercise with walking, but occurs when she rests after 5 min. Symptoms are described as SOB after exertion. She reports orthopnea and PND. She denies LE edema. She has been having symptoms for about 2 weeks. She has taken NTG four times in the past 2 weeks which has relieved her CP but gives her a headache. She is trying to avoid ASA due to her reflux, but is taking intermittently.    She is taking between 5-10 mg bystolic  daily, has not taken yet today. She is not taking ranexa  due to cost.     Past Medical History:  Diagnosis Date   CAD (coronary artery  disease), native coronary artery 01/20/2019   COVID-19    High cholesterol    Hyperlipemia 08/06/2018   Hypertension    Unstable angina (HCC) 03/06/2016    Past Surgical History:  Procedure Laterality Date   ABDOMINAL AORTOGRAM N/A 03/07/2016   Procedure: Abdominal Aortogram;  Surgeon: Gordy Bergamo, MD;  Location: [facility] INVASIVE CV LAB;  Service: Cardiovascular;  Laterality: N/A;   CARDIAC SURGERY     stent x 2   COLPOSCOPY  04/2018   CORONARY ANGIOGRAPHY N/A 03/07/2016   Procedure: Coronary Angiography and possible PCI;  Surgeon: Gordy Bergamo, MD;  Location: [facility] INVASIVE CV LAB;  Service: Cardiovascular;  Laterality: N/A;  07:30 Case please   LEFT HEART CATH AND CORONARY ANGIOGRAPHY N/A 06/07/2018   Procedure: LEFT HEART CATH AND CORONARY ANGIOGRAPHY;  Surgeon: Bergamo Gordy, MD;  Location: [facility] INVASIVE CV LAB;  Service: Cardiovascular;  Laterality: N/A;    Current Medications: Current Meds  Medication Sig   ALPRAZolam  (XANAX ) 0.25 MG tablet Take 1 tablet (0.25 mg total) by mouth daily as needed for anxiety.   amLODipine  (NORVASC ) 2.5 MG tablet Take 1 tablet (2.5 mg total) by mouth daily.   aspirin  81 MG tablet Take 81 mg by mouth daily.   Cholecalciferol  (VITAMIN D ) 2000 units CAPS Take 2,000 Units by mouth daily.   ezetimibe  (ZETIA ) 10 MG tablet Take 1 tablet (10  mg total) by mouth daily.   ibuprofen (ADVIL,MOTRIN) 200 MG tablet Take 200 mg by mouth every 6 (six) hours as needed for moderate pain.   isosorbide mononitrate (IMDUR) 30 MG 24 hr tablet Take 0.5 tablets (15 mg total) by mouth daily.   magnesium  30 MG tablet Take 30 mg by mouth once a week.   nitroGLYCERIN  (NITROSTAT ) 0.4 MG SL tablet Place 1 tablet (0.4 mg total) under the tongue every 5 (five) minutes as needed for chest pain.   omeprazole  (PRILOSEC) 20 MG capsule Take 1 capsule (20 mg total) by mouth 2 (two) times daily before a meal. (Patient taking differently: Take 20 mg by mouth daily.)   ranolazine  (RANEXA ) 500 MG 12 hr tablet  Take 1 tablet (500 mg total) by mouth 2 (two) times daily.   rosuvastatin  (CRESTOR ) 40 MG tablet Take 0.5 tablets (20 mg total) by mouth daily.   [DISCONTINUED] nebivolol  (BYSTOLIC ) 10 MG tablet Take one tablet by mouth daily as needed for BP greater than 140   [DISCONTINUED] nebivolol  (BYSTOLIC ) 2.5 MG tablet Take 1 tablet (2.5 mg total) by mouth daily.     Allergies:   Ramipril , Spironolactone , Carvedilol , Valsartan , Amlodipine , Repatha  [evolocumab ], and Rosuvastatin    Social History   Socioeconomic History   Marital status: Married    Spouse name: Not on file   Number of children: 2   Years of education: Not on file   Highest education level: Not on file  Occupational History   Not on file  Tobacco Use   Smoking status: Former    Current packs/day: 0.00    Average packs/day: 1 pack/day for 25.0 years (25.0 ttl pk-yrs)    Types: Cigarettes    Start date: 54    Quit date: 2006    Years since quitting: 19.8   Smokeless tobacco: Never  Vaping Use   Vaping status: Never Used  Substance and Sexual Activity   Alcohol use: Yes    Alcohol/week: 1.0 standard drink of alcohol    Types: 1 Glasses of wine per week    Comment: OCCASSIONAL   Drug use: No   Sexual activity: Not on file  Other Topics Concern   Not on file  Social History Narrative   Not on file   Social Drivers of Health   Financial Resource Strain: Not on file  Food Insecurity: Not on file  Transportation Needs: Not on file  Physical Activity: Not on file  Stress: Not on file  Social Connections: Unknown (06/03/2021)   Received from [facility]   Social Network    Social Network: Not on file     Family History: The patient's family history includes Cancer - Lung in her sister; Stroke in her father.  ROS:   Please see the history of present illness.     All other systems reviewed and are negative.  EKGs/Labs/Other Studies Reviewed:    The following studies were reviewed today:  Coronary angiogram  06/07/2018: Right dominant circulation, severely diffusely calcified vessels, previously placed stents in the proximal and mid RCA are widely patent with very mild neointimal hyperplasia in the proximal segment.   LAD is diffusely diseased and there is a focal 70 to 75% stenosis which is probably significant by FFR, however the LAD is diffusely diseased.  Lesion does not suggest unstable lesion and is heavily calcified.   Ramus and circumflex are moderately diffusely diseased and calcified without luminal high-grade stenosis.   Normal LV systolic function, normal LVEDP.   (  right coronary artery (3.0 x 18 mm Cypher drug-eluting stent on 05/16/2003 patent).   EKG Interpretation Date/Time:  Thursday November 30 2023 08:12:37 EST Ventricular Rate:  67 PR Interval:  184 QRS Duration:  144 QT Interval:  422 QTC Calculation: 445 R Axis:   -17  Text Interpretation: Normal sinus rhythm Left bundle branch block When compared with ECG of 22-Aug-2020 02:32, PREVIOUS ECG IS PRESENT Confirmed by Madie Slough ([id number]) on 11/30/2023 8:15:54 AM    Recent Labs: No results found for requested labs within last 365 days.  Recent Lipid Panel    Component Value Date/Time   CHOL 173 05/31/2021 0800   TRIG 105 05/31/2021 0800   HDL 55 05/31/2021 0800   CHOLHDL 5.3 06/06/2018 1707   VLDL 31 06/06/2018 1707   LDLCALC 99 05/31/2021 0800   LDLDIRECT 90 01/16/2019 0819     Risk Assessment/Calculations:                Physical Exam:    VS:  BP 132/70   Pulse 67   Ht 5' 3 (1.6 m)   Wt 152 lb (68.9 kg)   SpO2 99%   BMI 26.93 kg/m     Wt Readings from Last 3 Encounters:  11/30/23 152 lb (68.9 kg)  12/14/22 155 lb 9.6 oz (70.6 kg)  06/13/22 157 lb (71.2 kg)     GEN:  Well nourished, well developed in no acute distress HEENT: Normal NECK: No JVD; No carotid bruits LYMPHATICS: No lymphadenopathy CARDIAC: RRR, no murmurs, rubs, gallops RESPIRATORY:  Clear to auscultation without rales, wheezing  or rhonchi  ABDOMEN: Soft, non-tender, non-distended MUSCULOSKELETAL:  No edema; No deformity  SKIN: Warm and dry NEUROLOGIC:  Alert and oriented x 3 PSYCHIATRIC:  Normal affect   ASSESSMENT:    1. Atherosclerosis of native coronary artery of native heart with stable angina pectoris   2. LBBB (left bundle branch block)   3. Chest pain of uncertain etiology   4. DOE (dyspnea on exertion)   5. Primary hypertension   6. Hyperlipidemia with target LDL less than 70    PLAN:    In order of problems listed above:  Chest pain, DOE - NTG responsive - chest pain does not occur while walking, but occurs with DOE after she stops exercising - concerning for angina - we discussed next steps, she would like to avoid heart catheterization if possible - will adjust anti-anginals - stop bystolic  - start 2.5 mg amlodipine  qAM x 1 week then add 15 mg imdur qPM - will obtain echo - if abnormal will proceed with heart cath, if unremarkable, proceed with PET stress test - she is in agreement with this plan - recommend taking ASA regularly - continue PRN NTG - I suggested obtaining 20 mg crestor  script instead of breaking in half, she declines due to cost, we review cost of crestor  on [facility] vs changing to lipitor , she does not want to change at this point   CAD - DES-RCA 2005 - last heart cath in 2020 with severely calcified LAD, LCX, and ramus, patent RCA stent   Hypertension - PRN bystolic  --> recommended for 2.5 mg bystolic  daily at last visit - she is taking 5-10 mg bystolic  daily - will stop and start 2.5 mg amlodipine  qAM and 15 mg imdur qPM as above    Hyperlipidemia with LDL goal < 55 - LDL was 100 in Nov 2024 - she states zetia  helps her crestor  work - she had been  out of medications when labs drawn in November   Follow up in 1 month with Dr. Ladona.       Informed Consent   Shared Decision Making/Informed Consent The risks [chest pain, shortness of breath, cardiac  arrhythmias, dizziness, blood pressure fluctuations, myocardial infarction, stroke/transient ischemic attack, nausea, vomiting, allergic reaction, radiation exposure, metallic taste sensation and life-threatening complications (estimated to be 1 in 10,000)], benefits (risk stratification, diagnosing coronary artery disease, treatment guidance) and alternatives of a cardiac PET stress test were discussed in detail with Ms. Saldarriaga and she agrees to proceed.       Medication Adjustments/Labs and Tests Ordered: Current medicines are reviewed at length with the patient today.  Concerns regarding medicines are outlined above.  Orders Placed This Encounter  Procedures   NM PET CT CARDIAC PERFUSION MULTI W/ABSOLUTE BLOODFLOW   Cardiac Stress Test: Informed Consent Details: Physician/Practitioner Attestation; Transcribe to consent form and obtain patient signature   EKG 12-Lead   ECHOCARDIOGRAM COMPLETE   Meds ordered this encounter  Medications   isosorbide mononitrate (IMDUR) 30 MG 24 hr tablet    Sig: Take 0.5 tablets (15 mg total) by mouth daily.    Dispense:  45 tablet    Refill:  3   amLODipine  (NORVASC ) 2.5 MG tablet    Sig: Take 1 tablet (2.5 mg total) by mouth daily.    Dispense:  90 tablet    Refill:  3    Patient Instructions  Medication Instructions:  Stop Bystolic   Start Amlodipine  2.5 mg take one tablet in the morning for 1 week, then add  Start Imdur 30 mg take 1/2 tablet (15 mg) daily *If you need a refill on your cardiac medications before your next appointment, please call your pharmacy*  Lab Work: None ordered If you have labs (blood work) drawn today and your tests are completely normal, you will receive your results only by: MyChart Message (if you have MyChart) OR A paper copy in the mail If you have any lab test that is abnormal or we need to change your treatment, we will call you to review the results.  Testing/Procedures: Your physician has requested you have  a cardiac PET stress CT scan performed. These are completed at [facility]. A scheduler will call you to schedule an appointment to have this test completed.       Your physician has requested that you have an echocardiogram. Echocardiography is a painless test that uses sound waves to create images of your heart. It provides your doctor with information about the size and shape of your heart and how well your heart's chambers and valves are working. This procedure takes approximately one hour. There are no restrictions for this procedure. Please do NOT wear cologne, perfume, aftershave, or lotions (deodorant is allowed). Please arrive 15 minutes prior to your appointment time.  Please note: We ask at that you not bring children with you during ultrasound (echo/ vascular) testing. Due to room size and safety concerns, children are not allowed in the ultrasound rooms during exams. Our front office staff cannot provide observation of children in our lobby area while testing is being conducted. An adult accompanying a patient to their appointment will only be allowed in the ultrasound room at the discretion of the ultrasound technician under special circumstances. We apologize for any inconvenience.   Follow-Up: At [facility], you and your health needs are our priority.  As part of our continuing mission to provide you with exceptional  heart care, our providers are all part of one team.  This team includes your primary Cardiologist (physician) and Advanced Practice Providers or APPs (Physician Assistants and Nurse Practitioners) who all work together to provide you with the care you need, when you need it.  Your next appointment:   1 month(s)  Provider:   Gordy Bergamo, MD           Signed, Jon Garre Deloris Moger, [place]  11/30/2023 8:58 AM    [facility]

## 2023-11-29 ENCOUNTER — Other Ambulatory Visit (HOSPITAL_BASED_OUTPATIENT_CLINIC_OR_DEPARTMENT_OTHER): Payer: Self-pay | Admitting: Internal Medicine

## 2023-11-29 DIAGNOSIS — Z780 Asymptomatic menopausal state: Secondary | ICD-10-CM

## 2023-11-29 LAB — LAB REPORT - SCANNED
EGFR: 83
TSH: 2.09

## 2023-11-30 ENCOUNTER — Encounter: Payer: Self-pay | Admitting: Physician Assistant

## 2023-11-30 ENCOUNTER — Ambulatory Visit: Attending: Physician Assistant | Admitting: Physician Assistant

## 2023-11-30 VITALS — BP 132/70 | HR 67 | Ht 63.0 in | Wt 152.0 lb

## 2023-11-30 DIAGNOSIS — I10 Essential (primary) hypertension: Secondary | ICD-10-CM

## 2023-11-30 DIAGNOSIS — R079 Chest pain, unspecified: Secondary | ICD-10-CM

## 2023-11-30 DIAGNOSIS — I25118 Atherosclerotic heart disease of native coronary artery with other forms of angina pectoris: Secondary | ICD-10-CM | POA: Diagnosis not present

## 2023-11-30 DIAGNOSIS — I447 Left bundle-branch block, unspecified: Secondary | ICD-10-CM | POA: Diagnosis not present

## 2023-11-30 DIAGNOSIS — E785 Hyperlipidemia, unspecified: Secondary | ICD-10-CM | POA: Diagnosis not present

## 2023-11-30 DIAGNOSIS — R0609 Other forms of dyspnea: Secondary | ICD-10-CM

## 2023-11-30 MED ORDER — AMLODIPINE BESYLATE 2.5 MG PO TABS: 2.5000 mg | ORAL_TABLET | Freq: Every day | ORAL | 3 refills | Status: DC

## 2023-11-30 MED ORDER — ISOSORBIDE MONONITRATE ER 30 MG PO TB24: 15.0000 mg | ORAL_TABLET | Freq: Every day | ORAL | 3 refills | Status: DC

## 2023-11-30 NOTE — Patient Instructions (Addendum)
 Medication Instructions:  Stop Bystolic   Start Amlodipine  2.5 mg take one tablet in the morning for 1 week, then add  Start Imdur 30 mg take 1/2 tablet (15 mg) daily *If you need a refill on your cardiac medications before your next appointment, please call your pharmacy*  Lab Work: None ordered If you have labs (blood work) drawn today and your tests are completely normal, you will receive your results only by: MyChart Message (if you have MyChart) OR A paper copy in the mail If you have any lab test that is abnormal or we need to change your treatment, we will call you to review the results.  Testing/Procedures: Your physician has requested you have a cardiac PET stress CT scan performed. These are completed at [facility]. A scheduler will call you to schedule an appointment to have this test completed.       Your physician has requested that you have an echocardiogram. Echocardiography is a painless test that uses sound waves to create images of your heart. It provides your doctor with information about the size and shape of your heart and how well your heart's chambers and valves are working. This procedure takes approximately one hour. There are no restrictions for this procedure. Please do NOT wear cologne, perfume, aftershave, or lotions (deodorant is allowed). Please arrive 15 minutes prior to your appointment time.  Please note: We ask at that you not bring children with you during ultrasound (echo/ vascular) testing. Due to room size and safety concerns, children are not allowed in the ultrasound rooms during exams. Our front office staff cannot provide observation of children in our lobby area while testing is being conducted. An adult accompanying a patient to their appointment will only be allowed in the ultrasound room at the discretion of the ultrasound technician under special circumstances. We apologize for any inconvenience.   Follow-Up: At [facility], you and your health needs are our priority.  As part of our continuing mission to provide you with exceptional heart care, our providers are all part of one team.  This team includes your primary Cardiologist (physician) and Advanced Practice Providers or APPs (Physician Assistants and Nurse Practitioners) who all work together to provide you with the care you need, when you need it.  Your next appointment:   1 month(s)  Provider:   Gordy Bergamo, MD

## 2023-12-22 IMAGING — CR DG CHEST 2V
2 series · 2 of 2 positions shown · non-contrast
Comparison: 08/22/2020

CLINICAL DATA: 66-year-old female with chest pain

EXAM:
CHEST - 2 VIEW

[w chest pa]
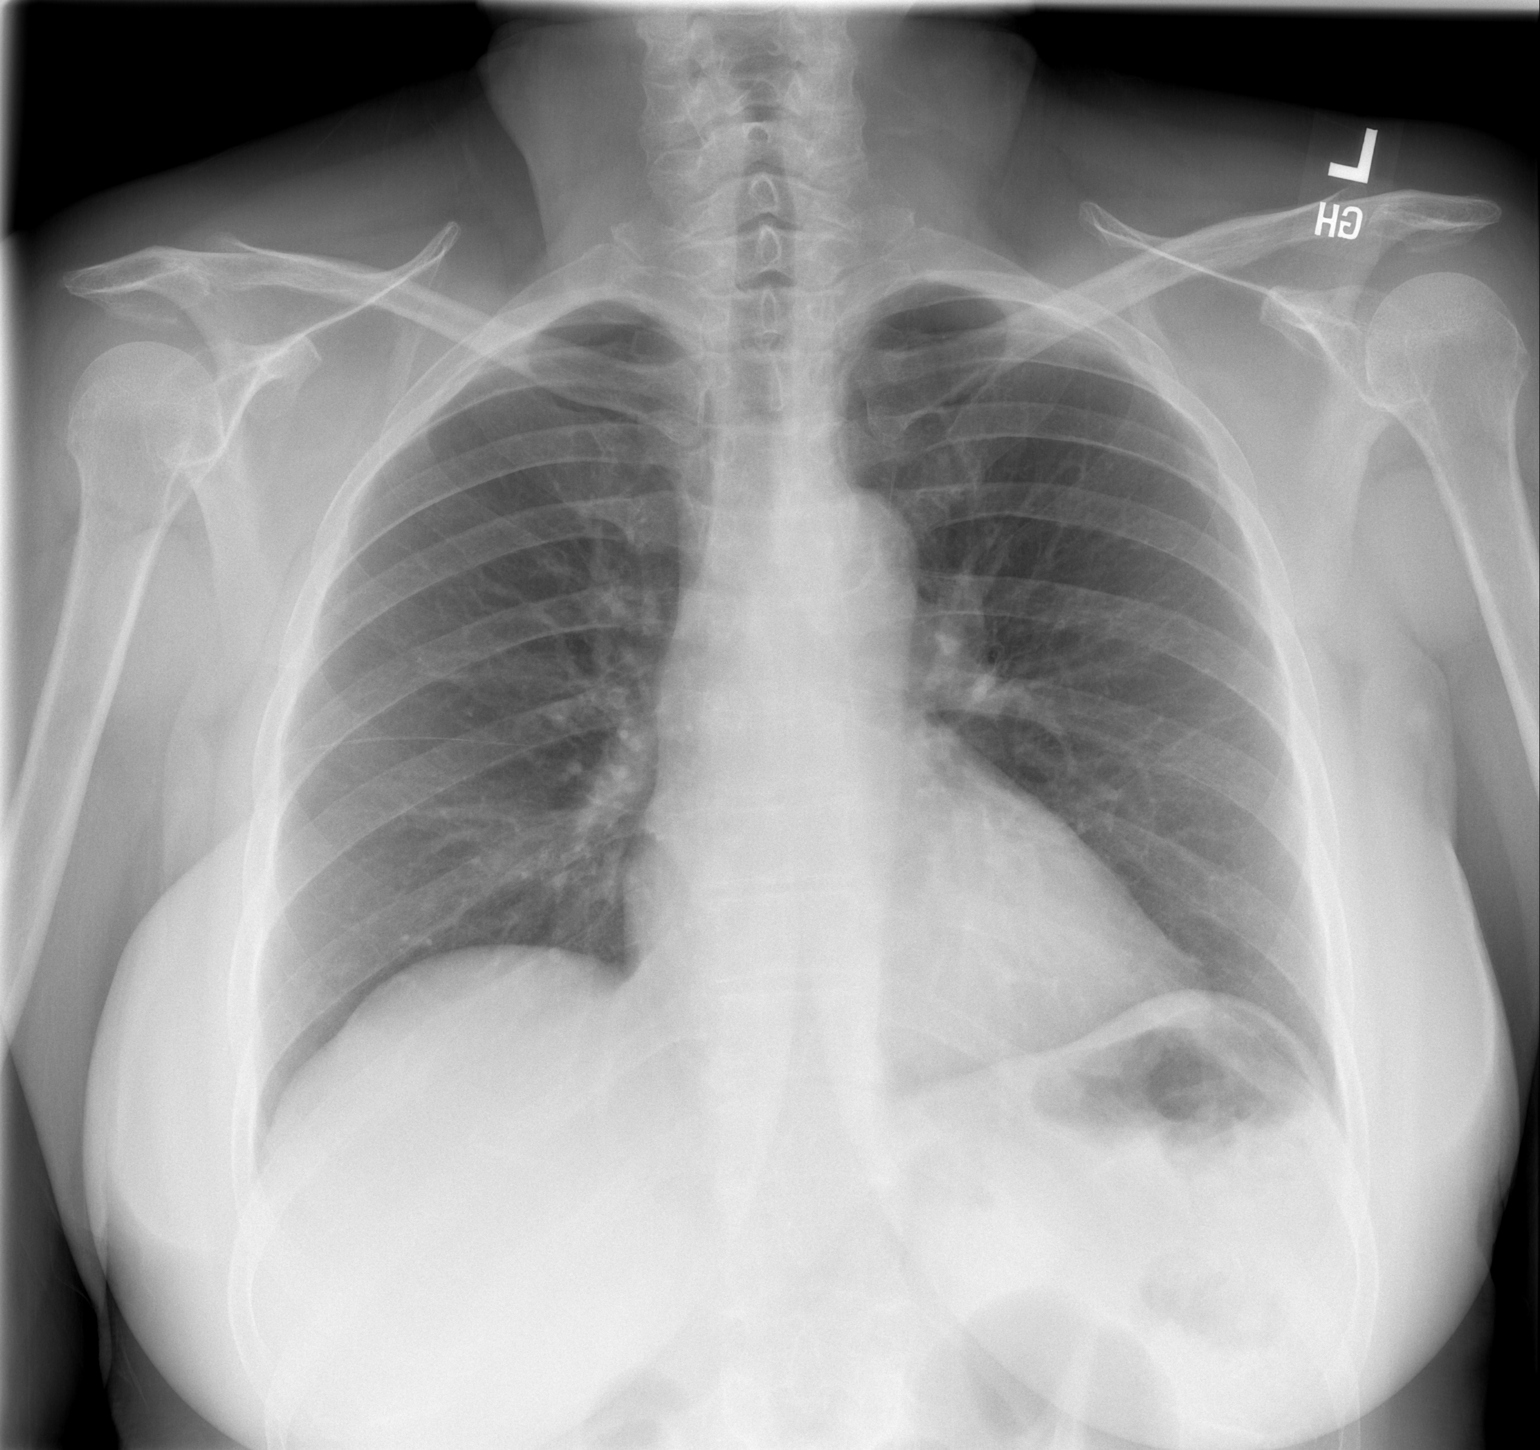

[w chest lat]
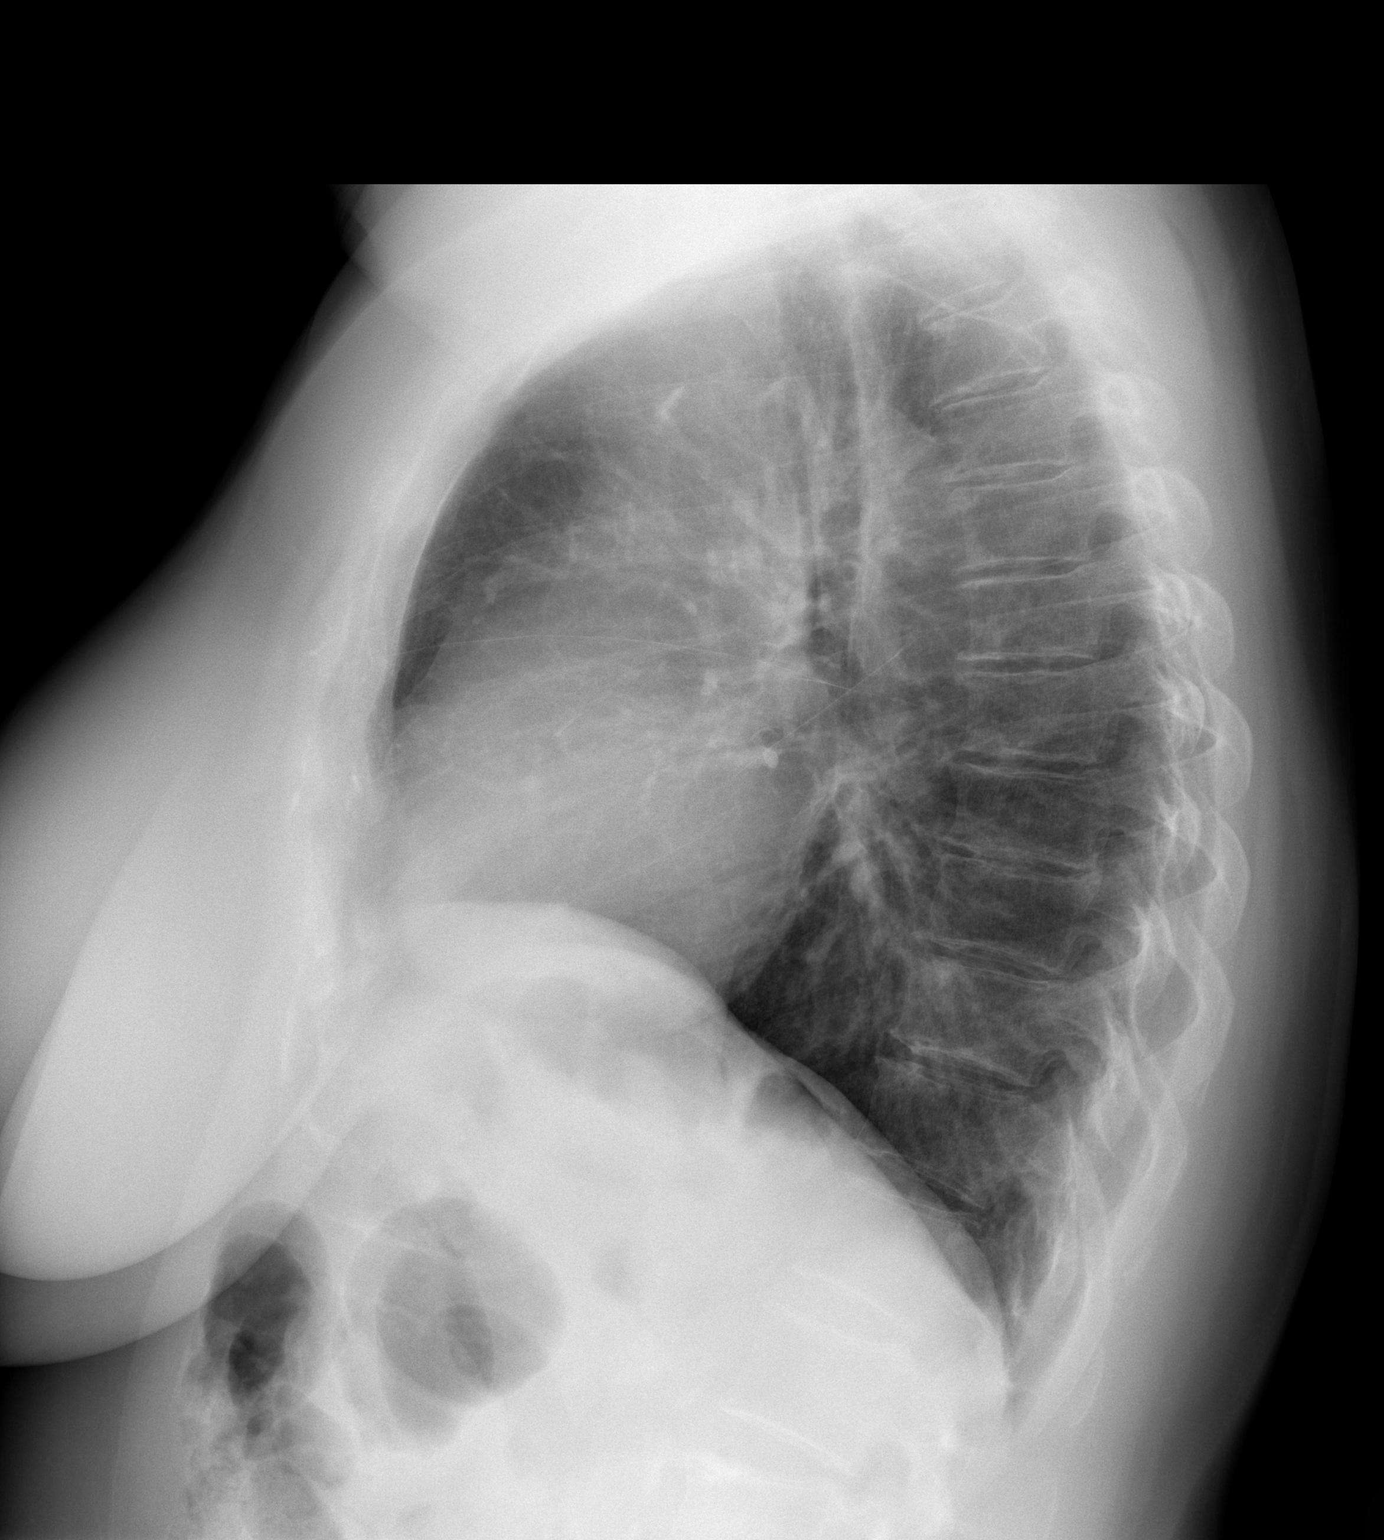

[2 of 2 positions shown; findings below may reference images not displayed]

FINDINGS: Cardiomediastinal silhouette unchanged in size and contour. No
evidence of central vascular congestion. No interlobular septal
thickening.

No pneumothorax or pleural effusion. Coarsened interstitial
markings, with no confluent airspace disease.

No acute displaced fracture. Degenerative changes of the spine.
IMPRESSION: No active cardiopulmonary disease.

## 2024-01-05 ENCOUNTER — Ambulatory Visit (HOSPITAL_COMMUNITY)
Admission: RE | Admit: 2024-01-05 | Discharge: 2024-01-05 | Disposition: A | Source: Ambulatory Visit | Attending: Physician Assistant

## 2024-01-05 DIAGNOSIS — R0609 Other forms of dyspnea: Secondary | ICD-10-CM

## 2024-01-05 DIAGNOSIS — R079 Chest pain, unspecified: Secondary | ICD-10-CM | POA: Diagnosis not present

## 2024-01-05 LAB — ECHOCARDIOGRAM COMPLETE
Area-P 1/2: 3.2 cm2
S' Lateral: 3.2 cm

## 2024-01-06 ENCOUNTER — Ambulatory Visit: Payer: Self-pay | Admitting: Cardiology

## 2024-01-08 ENCOUNTER — Encounter (HOSPITAL_COMMUNITY): Payer: Self-pay

## 2024-01-08 NOTE — Telephone Encounter (Signed)
 FYI

## 2024-01-09 ENCOUNTER — Ambulatory Visit (HOSPITAL_COMMUNITY)
Admission: RE | Admit: 2024-01-09 | Discharge: 2024-01-09 | Disposition: A | Source: Ambulatory Visit | Attending: Physician Assistant

## 2024-01-09 DIAGNOSIS — R079 Chest pain, unspecified: Secondary | ICD-10-CM | POA: Insufficient documentation

## 2024-01-09 DIAGNOSIS — R0609 Other forms of dyspnea: Secondary | ICD-10-CM | POA: Insufficient documentation

## 2024-01-09 LAB — NM PET CT CARDIAC PERFUSION MULTI W/ABSOLUTE BLOODFLOW
LV dias vol: 88 mL (ref 46–106)
LV sys vol: 42 mL (ref 3.8–5.2)
MBFR: 2.21
Nuc Rest EF: 52 %
Nuc Stress EF: 54 %
Peak HR: 96 {beats}/min
Rest HR: 94 {beats}/min
Rest MBF: 1.4 ml/g/min
Rest Nuclear Isotope Dose: 18 mCi
ST Depression (mm): 0 mm
Stress MBF: 3.1 ml/g/min
Stress Nuclear Isotope Dose: 18.1 mCi

## 2024-01-09 MED ORDER — RUBIDIUM RB82 GENERATOR (RUBYFILL)
17.0000 | PACK | Freq: Once | INTRAVENOUS | Status: AC
  Administered 2024-01-09: 12:00:00 18.15 via INTRAVENOUS

## 2024-01-09 MED ORDER — REGADENOSON 0.4 MG/5ML IV SOLN
0.4000 mg | Freq: Once | INTRAVENOUS | Status: AC
  Administered 2024-01-09: 12:00:00 0.4 mg via INTRAVENOUS

## 2024-01-09 MED ORDER — REGADENOSON 0.4 MG/5ML IV SOLN
INTRAVENOUS | Status: AC
  Filled 2024-01-09: qty 5

## 2024-01-09 MED ORDER — RUBIDIUM RB82 GENERATOR (RUBYFILL)
17.0000 | PACK | Freq: Once | INTRAVENOUS | Status: AC
  Administered 2024-01-09: 12:00:00 17.9 via INTRAVENOUS

## 2024-02-08 ENCOUNTER — Other Ambulatory Visit: Payer: Self-pay

## 2024-02-08 ENCOUNTER — Encounter: Payer: Self-pay | Admitting: Cardiology

## 2024-02-08 ENCOUNTER — Ambulatory Visit: Attending: Cardiology | Admitting: Cardiology

## 2024-02-08 ENCOUNTER — Other Ambulatory Visit (HOSPITAL_COMMUNITY): Payer: Self-pay

## 2024-02-08 VITALS — BP 151/85 | HR 92 | Ht 63.0 in | Wt 156.4 lb

## 2024-02-08 DIAGNOSIS — Z91148 Patient's other noncompliance with medication regimen for other reason: Secondary | ICD-10-CM | POA: Diagnosis not present

## 2024-02-08 DIAGNOSIS — I447 Left bundle-branch block, unspecified: Secondary | ICD-10-CM

## 2024-02-08 DIAGNOSIS — I25118 Atherosclerotic heart disease of native coronary artery with other forms of angina pectoris: Secondary | ICD-10-CM

## 2024-02-08 DIAGNOSIS — I5022 Chronic systolic (congestive) heart failure: Secondary | ICD-10-CM | POA: Diagnosis not present

## 2024-02-08 DIAGNOSIS — I10 Essential (primary) hypertension: Secondary | ICD-10-CM | POA: Diagnosis not present

## 2024-02-08 MED ORDER — SACUBITRIL-VALSARTAN 24-26 MG PO TABS
1.0000 | ORAL_TABLET | Freq: Two times a day (BID) | ORAL | 2 refills | Status: AC
  Filled 2024-02-08: qty 60, 30d supply, fill #0

## 2024-02-08 MED ORDER — RANOLAZINE ER 500 MG PO TB12
500.0000 mg | ORAL_TABLET | Freq: Two times a day (BID) | ORAL | 1 refills | Status: AC
  Filled 2024-02-08: qty 180, 90d supply, fill #0

## 2024-02-08 MED ORDER — PINDOLOL 5 MG PO TABS
5.0000 mg | ORAL_TABLET | Freq: Every day | ORAL | 2 refills | Status: AC
  Filled 2024-02-08: qty 30, 30d supply, fill #0

## 2024-02-08 NOTE — Patient Instructions (Signed)
 Medication Instructions:  START   pindolol  (VISKEN ) 5 MG tablet         Take 1 tablet (5 mg total) by mouth daily.     ranolazine  (RANEXA ) 500 MG 12 hr tablet        Take 1 tablet (500 mg total) by mouth 2 (two) times daily.,      sacubitril -valsartan  (ENTRESTO ) 24-26 MG        Take 1 tablet by mouth 2 (two) times daily   *If you need a refill on your cardiac medications before your next appointment, please call your pharmacy*  Lab Work: (TODAY) Lab Orders         Lipid Panel With LDL/HDL Ratio         LDL cholesterol, direct     If you have labs (blood work) drawn today and your tests are completely normal, you will receive your results only by: MyChart Message (if you have MyChart) OR A paper copy in the mail If you have any lab test that is abnormal or we need to change your treatment, we will call you to review the results.  Follow-Up: At [facility], you and your health needs are our priority.  As part of our continuing mission to provide you with exceptional heart care, our providers are all part of one team.  This team includes your primary Cardiologist (physician) and Advanced Practice Providers or APPs (Physician Assistants and Nurse Practitioners) who all work together to provide you with the care you need, when you need it.  Your next appointment:   May 06, 2024 @ 11am   Provider:   Gordy Bergamo, MD    We recommend signing up for the patient portal called MyChart.  Sign up information is provided on this After Visit Summary.  MyChart is used to connect with patients for Virtual Visits (Telemedicine).  Patients are able to view lab/test results, encounter notes, upcoming appointments, etc.  Non-urgent messages can be sent to your provider as well.   To learn more about what you can do with MyChart, go to [email or web address].

## 2024-02-08 NOTE — Progress Notes (Signed)
 " Cardiology Office Note:  .   Date:  02/08/2024  ID:  Tiffany Ryan, DOB October 30, 1955, MRN [id number] PCP: Dwight Trula SQUIBB, MD  [facility] Providers Cardiologist:  Gordy Bergamo, MD   History of Present Illness: .   Tiffany Ryan is a 69 y.o. female with a hx of family history of premature CAD, hypertension, hypercholesterolemia, chronic LBBB, multiple medication intolerances, CAD s/p DES-mid RCA (3.0 x 18 mm - 05/16/2003).   Heart catheterization 06/07/2018 showed severe heavy calcification in all 3 vessels with patent RCA stent.  She had 70-75% stenosis in the mid to distal LAD felt physiologically significant but recommended for medical therapy in the ICU of diffuse calcification and uncontrolled hyperlipidemia and medication noncompliance.  PET stress 01/09/2024 was nonischemic with normal LVEF at 54% and echocardiogram 01/05/2024 revealing EF 30 to 35% with mild to moderate MR which was unchanged from 05/20/2021: EF has dropped from 50-55%. She is accompanied by her daughter who is a pediatric intensivist at [employer], [place] .  Daughter states that her mom has been active on the treadmill without chest pain although she complaints of chest pain at rest or minimal activity. Patient has multiple medications allergies/intolerances.    Discussed the use of AI scribe software for clinical note transcription with the patient, who gave verbal consent to proceed.  History of Present Illness Tiffany Ryan is a 69 year old female with coronary artery disease and reduced ejection fraction who presents with medication intolerance and chest pressure. She is accompanied by her daughter, who is also a physician.  She has persistent left-sided chest pressure mostly at rest, with no pain during exertion and is able to walk almost three miles on a treadmill without symptoms. A recent stress test and PET perfusion scan showed no ischemia.  She has significant medication intolerance. Very low doses of  Bystolic  and Hyftor were tolerated, but she is unsure which medications help or worsen symptoms. She stopped amlodipine  2.5 mg because of shoulder, neck, and head pain. She also reports dizziness and episodes of dark urine with some medications.  Her home blood pressure has been markedly elevated, up to 225, though white coat effect is suspected. She has hyperlipidemia with LDL 138 mg/dL in November 7974. She cannot tolerate most statins due to muscle pain. She has been on Zetia  5 mg since December 2025 and currently takes rosuvastatin  20 mg daily, which she tolerates better than prior statins.  Her ejection fraction has declined from 45-50% to 30-35% over the past year on echocardiogram. She has coronary artery disease with diffuse LAD and circumflex disease and a patent right coronary artery stent. She feels dizzy, which she thinks may be related to medications or dehydration.  She maintains regular exercise, walking several miles daily, and follows a careful diet, but remains worried that her symptoms and medication side effects are limiting safe and effective treatment.  Cardiac Studies relevent.    Coronary angiogram 06/07/2018: (right coronary artery (3.0 x 18 mm Cypher drug-eluting stent on 05/16/2003 patent) Normal LV systolic function, normal LVEDP.      NM PET CT CARDIAC PERFUSION MULTI W/ABSOLUTE BLOODFLOW 01/09/2024    The study is normal. The study is low risk.   LV perfusion is normal.   Rest left ventricular function is normal. Rest EF: 52%. Stress left ventricular function is normal. Stress EF: 54%. End diastolic cavity size is normal. End systolic cavity size is normal.   Myocardial blood flow was computed to be 1.38ml/g/min at  rest and 3.10ml/g/min at stress. Global myocardial blood flow reserve was 2.21 and was normal.   Coronary calcium  was present on the attenuation correction CT images. Severe coronary calcifications were present. Coronary calcifications were present in the  left anterior descending artery, left circumflex artery and right coronary artery distribution(s).  ECHOCARDIOGRAM COMPLETE 01/05/2024 1. Left ventricular ejection fraction, by estimation, is 30 to 35%. The left ventricle has moderately decreased function. The left ventricle demonstrates global hypokinesis. There is mild left ventricular hypertrophy. Left ventricular diastolic parameters are consistent with Grade I diastolic dysfunction (impaired relaxation). 2. Right ventricular systolic function is normal. The right ventricular size is normal. 3. The mitral valve is normal in structure. Mild to moderate mitral valve regurgitation. 4.  LVEF decreased from 50% compared to study from 05/20/2021.  EKG:      Labs    Lab Results  Component Value Date   TSH 2.09 11/29/2023    Care everywhere/Faxed External Labs:  Labs 03/23/2023:  Hb 13.4/HCT 39.8, platelets 256, normal indicis.  Serum glucose 1 112 mg, BUN 12, creatinine 0.71, eGFR 93 mL, potassium 4.7.  LFTs normal.  Labs 12/03/2023:  Total cholesterol 203, triglycerides 108, HDL 46, LDL 138.  TSH normal, vitamin D  44.5.  A1c 6.3%.  BUN 14, creatinine 0.78, eGFR 83 mL, potassium 4.9.  ROS  Review of Systems  Cardiovascular:  Positive for chest pain. Negative for dyspnea on exertion and leg swelling.   Physical Exam:   VS:  BP (!) 151/85 (BP Location: Left Arm, Patient Position: Sitting, Cuff Size: Normal)   Pulse 92   Ht 5' 3 (1.6 m)   Wt 156 lb 6.4 oz (70.9 kg)   SpO2 99%   BMI 27.71 kg/m    Wt Readings from Last 3 Encounters:  02/08/24 156 lb 6.4 oz (70.9 kg)  11/30/23 152 lb (68.9 kg)  12/14/22 155 lb 9.6 oz (70.6 kg)    BP Readings from Last 3 Encounters:  02/08/24 (!) 151/85  01/09/24 (!) 140/65  11/30/23 132/70   Physical Exam Neck:     Vascular: No carotid bruit or JVD.  Cardiovascular:     Rate and Rhythm: Normal rate and regular rhythm.     Pulses: Intact distal pulses.     Heart sounds: Normal heart  sounds. No murmur heard.    No gallop.     Comments: Split Heart sounds Pulmonary:     Effort: Pulmonary effort is normal.     Breath sounds: Normal breath sounds.  Abdominal:     General: Bowel sounds are normal.     Palpations: Abdomen is soft.  Musculoskeletal:     Right lower leg: No edema.     Left lower leg: No edema.    ASSESSMENT AND PLAN: .      ICD-10-CM   1. Chronic heart failure with reduced ejection fraction (HFrEF, <= 40%) (HCC)  I50.22 pindolol  (VISKEN ) 5 MG tablet    sacubitril -valsartan  (ENTRESTO ) 24-26 MG    2. LBBB (left bundle branch block)  I44.7     3. Atherosclerosis of native coronary artery of native heart with stable angina pectoris  I25.118 ranolazine  (RANEXA ) 500 MG 12 hr tablet    Lipid Panel With LDL/HDL Ratio    LDL cholesterol, direct    4. Primary hypertension  I10     5. Non compliance w medication regimen  Z91.148      Assessment & Plan Chronic heart failure with reduced ejection fraction (EF 35%) Chronic heart  failure with reduced ejection fraction at 35%, indicating a decline from previous measurements. No active heart failure symptoms. Heart function is globally reduced, not localized, suggesting no recent myocardial infarction. Stress test and PET perfusion scan show no ischemia, indicating non-cardiac chest pain. Medication noncompliance complicates management. Discussed potential benefits of medication adherence to improve heart function and prevent further decline. Consideration of pacemaker placement if heart function does not improve with medication. - Started Entresto  at the smallest dose. - Started pindolol  5 mg once daily. - Refilled Ranexa . - Will repeat echocardiogram in 6 months.  Atherosclerotic heart disease of native coronary arteries with stable angina and left bundle branch block Atherosclerotic heart disease with diffuse coronary artery disease. Previous coronary angiogram showed diffuse disease in circumflex and LAD,  with a focal stenosis in the mid LAD. No ischemia on recent stress test. Left bundle branch block present. Current management focuses on medical therapy due to diffuse nature of disease and lack of ischemia. Discussed that stenting may worsen condition due to diffuse disease and lack of ischemia. - Continue current medical management without stenting.  Primary hypertension Episodic high blood pressure. Blood pressure readings vary, with some high readings at home. White coat hypertension may contribute to elevated readings in clinical settings. Medication noncompliance complicates management. Discussed importance of medication adherence to control blood pressure and prevent complications.  Hyperlipidemia LDL at 138 mg/dL, above target for her age and cardiovascular risk profile. Previous intolerance to statins and PCSK9 inhibitors. Currently on rosuvastatin  20 mg and Zetia  5 mg. Discussed importance of lipid control to reduce cardiovascular risk. Encouraged to increase Zetia  to 10 mg daily to improve lipid profile. - Increased Zetia  to 10 mg daily. - Checked cholesterol levels today.  Medication noncompliance Significant medication noncompliance due to side effects and intolerance. Multiple trials of beta blockers and statins have been unsuccessful. Discussed importance of medication adherence to manage heart failure and hyperlipidemia. Emphasized need to find tolerable medication regimen to improve health outcomes. - Encouraged adherence to new medication regimen with low doses to minimize side effects.   Follow up: Non ischemic cardiomyopathy, HTN, HLD 3 months  Signed,  Gordy Bergamo, MD, [facility] 02/08/2024, 11:54 AM [facility] [address] [place], [place] [address] Phone: [phone]. Fax:  [phone]  "

## 2024-02-09 ENCOUNTER — Ambulatory Visit: Payer: Self-pay | Admitting: Cardiology

## 2024-02-09 LAB — LIPID PANEL WITH LDL/HDL RATIO
Cholesterol, Total: 215 mg/dL — ABNORMAL HIGH (ref 100–199)
HDL: 49 mg/dL
LDL Chol Calc (NIH): 148 mg/dL — ABNORMAL HIGH (ref 0–99)
LDL/HDL Ratio: 3 ratio (ref 0.0–3.2)
Triglycerides: 100 mg/dL (ref 0–149)
VLDL Cholesterol Cal: 18 mg/dL (ref 5–40)

## 2024-02-09 LAB — LDL CHOLESTEROL, DIRECT: LDL Direct: 142 mg/dL — ABNORMAL HIGH (ref 0–99)

## 2024-02-09 NOTE — Progress Notes (Signed)
 Lipids are not well-controlled, not sure whether she is taking her medications.  We will recheck them next time on our office visit in 3 months.

## 2024-05-06 ENCOUNTER — Ambulatory Visit: Admitting: Cardiology
